# Patient Record
Sex: Female | Born: 1970 | Race: White | Hispanic: Yes | Marital: Married | Smoking: Never smoker
Health system: Southern US, Community
[De-identification: ages and names within clinical notes are randomized; demographics above are authoritative.]

---

## 2005-12-19 ENCOUNTER — Other Ambulatory Visit: Admission: RE | Admit: 2005-12-19 | Discharge: 2005-12-19 | Payer: Self-pay | Admitting: Gynecology

## 2008-07-18 ENCOUNTER — Inpatient Hospital Stay (HOSPITAL_COMMUNITY): Admission: AD | Admit: 2008-07-18 | Discharge: 2008-07-18 | Payer: Self-pay | Admitting: Obstetrics & Gynecology

## 2008-07-18 ENCOUNTER — Encounter: Payer: Self-pay | Admitting: Obstetrics & Gynecology

## 2008-07-21 ENCOUNTER — Inpatient Hospital Stay (HOSPITAL_COMMUNITY): Admission: AD | Admit: 2008-07-21 | Discharge: 2008-07-21 | Payer: Self-pay | Admitting: Obstetrics & Gynecology

## 2008-09-01 ENCOUNTER — Encounter (INDEPENDENT_AMBULATORY_CARE_PROVIDER_SITE_OTHER): Payer: Self-pay | Admitting: Family Medicine

## 2008-09-01 ENCOUNTER — Ambulatory Visit: Payer: Self-pay | Admitting: Family Medicine

## 2008-09-01 DIAGNOSIS — O039 Complete or unspecified spontaneous abortion without complication: Secondary | ICD-10-CM | POA: Insufficient documentation

## 2008-12-22 ENCOUNTER — Ambulatory Visit: Payer: Self-pay | Admitting: Family Medicine

## 2009-01-06 ENCOUNTER — Encounter: Payer: Self-pay | Admitting: Family Medicine

## 2009-01-06 ENCOUNTER — Ambulatory Visit: Payer: Self-pay | Admitting: Family Medicine

## 2009-01-06 LAB — CONVERTED CEMR LAB
Basophils Relative: 0 % (ref 0–1)
Eosinophils Absolute: 0.2 10*3/uL (ref 0.0–0.7)
HCT: 34.6 % — ABNORMAL LOW (ref 36.0–46.0)
Hemoglobin: 11.6 g/dL — ABNORMAL LOW (ref 12.0–15.0)
Lymphs Abs: 2.5 10*3/uL (ref 0.7–4.0)
MCHC: 33.5 g/dL (ref 30.0–36.0)
MCV: 89.2 fL (ref 78.0–100.0)
Monocytes Absolute: 0.6 10*3/uL (ref 0.1–1.0)
Monocytes Relative: 7 % (ref 3–12)
Neutrophils Relative %: 60 % (ref 43–77)
RBC: 3.88 M/uL (ref 3.87–5.11)
Rh Type: POSITIVE
Rubella: >500 intl units/mL — ABNORMAL HIGH
Sickle Cell Screen: NEGATIVE
WBC: 8 10*3/uL (ref 4.0–10.5)

## 2009-01-07 ENCOUNTER — Encounter: Payer: Self-pay | Admitting: Family Medicine

## 2009-01-09 ENCOUNTER — Encounter: Payer: Self-pay | Admitting: Family Medicine

## 2009-01-17 ENCOUNTER — Encounter: Payer: Self-pay | Admitting: Family Medicine

## 2009-01-17 ENCOUNTER — Ambulatory Visit: Payer: Self-pay | Admitting: Family Medicine

## 2009-01-17 LAB — CONVERTED CEMR LAB: GC Probe Amp, Genital: NEGATIVE

## 2009-01-26 ENCOUNTER — Encounter: Payer: Self-pay | Admitting: Family Medicine

## 2009-01-26 ENCOUNTER — Ambulatory Visit: Payer: Self-pay | Admitting: Family Medicine

## 2009-01-26 DIAGNOSIS — O99810 Abnormal glucose complicating pregnancy: Secondary | ICD-10-CM | POA: Insufficient documentation

## 2009-01-26 LAB — CONVERTED CEMR LAB: Glucose, Fasting: 86 mg/dL (ref 70–99)

## 2009-02-15 ENCOUNTER — Encounter: Payer: Self-pay | Admitting: Family Medicine

## 2009-02-15 ENCOUNTER — Ambulatory Visit: Payer: Self-pay | Admitting: Family Medicine

## 2009-02-15 DIAGNOSIS — O09529 Supervision of elderly multigravida, unspecified trimester: Secondary | ICD-10-CM | POA: Insufficient documentation

## 2009-02-17 ENCOUNTER — Encounter: Payer: Self-pay | Admitting: Family Medicine

## 2009-02-22 ENCOUNTER — Ambulatory Visit (HOSPITAL_COMMUNITY): Admission: RE | Admit: 2009-02-22 | Discharge: 2009-02-22 | Payer: Self-pay | Admitting: Family Medicine

## 2009-02-22 ENCOUNTER — Encounter: Payer: Self-pay | Admitting: Family Medicine

## 2009-03-17 ENCOUNTER — Ambulatory Visit: Payer: Self-pay | Admitting: Family Medicine

## 2009-04-20 ENCOUNTER — Ambulatory Visit: Payer: Self-pay | Admitting: Family Medicine

## 2009-05-03 ENCOUNTER — Ambulatory Visit: Payer: Self-pay | Admitting: Family Medicine

## 2009-05-04 ENCOUNTER — Encounter: Payer: Self-pay | Admitting: Family Medicine

## 2009-05-04 ENCOUNTER — Ambulatory Visit: Payer: Self-pay | Admitting: Family Medicine

## 2009-05-15 ENCOUNTER — Encounter: Payer: Self-pay | Admitting: Family Medicine

## 2009-05-15 ENCOUNTER — Ambulatory Visit: Payer: Self-pay | Admitting: Family Medicine

## 2009-05-15 LAB — CONVERTED CEMR LAB
Chlamydia, DNA Probe: NEGATIVE
Whiff Test: NEGATIVE

## 2009-05-23 ENCOUNTER — Telehealth: Payer: Self-pay | Admitting: *Deleted

## 2009-06-01 ENCOUNTER — Ambulatory Visit: Payer: Self-pay | Admitting: Family Medicine

## 2009-06-19 ENCOUNTER — Encounter: Payer: Self-pay | Admitting: Family Medicine

## 2009-06-19 ENCOUNTER — Ambulatory Visit: Payer: Self-pay | Admitting: Family Medicine

## 2009-06-19 LAB — CONVERTED CEMR LAB: Chlamydia, DNA Probe: NEGATIVE

## 2009-06-20 ENCOUNTER — Encounter: Payer: Self-pay | Admitting: Family Medicine

## 2009-06-29 ENCOUNTER — Ambulatory Visit: Payer: Self-pay | Admitting: Family Medicine

## 2009-06-29 ENCOUNTER — Encounter: Payer: Self-pay | Admitting: Family Medicine

## 2009-06-29 DIAGNOSIS — B951 Streptococcus, group B, as the cause of diseases classified elsewhere: Secondary | ICD-10-CM | POA: Insufficient documentation

## 2009-06-29 LAB — CONVERTED CEMR LAB

## 2009-07-06 ENCOUNTER — Encounter: Payer: Self-pay | Admitting: *Deleted

## 2009-07-06 ENCOUNTER — Ambulatory Visit: Payer: Self-pay | Admitting: Family Medicine

## 2009-07-11 ENCOUNTER — Ambulatory Visit: Payer: Self-pay | Admitting: Family Medicine

## 2009-07-11 DIAGNOSIS — Z6841 Body Mass Index (BMI) 40.0 and over, adult: Secondary | ICD-10-CM | POA: Insufficient documentation

## 2009-07-11 DIAGNOSIS — E669 Obesity, unspecified: Secondary | ICD-10-CM | POA: Insufficient documentation

## 2009-07-16 ENCOUNTER — Inpatient Hospital Stay (HOSPITAL_COMMUNITY): Admission: AD | Admit: 2009-07-16 | Discharge: 2009-07-18 | Payer: Self-pay | Admitting: Obstetrics & Gynecology

## 2009-07-16 ENCOUNTER — Encounter: Payer: Self-pay | Admitting: Family Medicine

## 2009-07-16 ENCOUNTER — Encounter: Payer: Self-pay | Admitting: Obstetrics & Gynecology

## 2009-07-16 ENCOUNTER — Ambulatory Visit: Payer: Self-pay | Admitting: Obstetrics and Gynecology

## 2009-08-24 ENCOUNTER — Ambulatory Visit: Payer: Self-pay | Admitting: Family Medicine

## 2009-10-13 ENCOUNTER — Ambulatory Visit: Payer: Self-pay | Admitting: Family Medicine

## 2009-10-13 DIAGNOSIS — L600 Ingrowing nail: Secondary | ICD-10-CM | POA: Insufficient documentation

## 2009-10-13 DIAGNOSIS — B351 Tinea unguium: Secondary | ICD-10-CM | POA: Insufficient documentation

## 2009-10-16 ENCOUNTER — Encounter: Payer: Self-pay | Admitting: Family Medicine

## 2009-11-06 ENCOUNTER — Ambulatory Visit: Payer: Self-pay | Admitting: Family Medicine

## 2010-07-31 NOTE — Miscellaneous (Signed)
Summary: Fax records  Clinical Lists Changes   Printed out prenatal summary with + GBS and asked red team [facility].  1o MD [facility], backup Sharen Hones ( but i'll be gone from next week until 1/31.) Eustaquio Boyden  MD  July 06, 2009 11:26 AM  summary [facility]ed [facility].Ezzard Ditmer CMA  July 06, 2009 12:00 PM

## 2010-07-31 NOTE — Assessment & Plan Note (Signed)
Summary: f/u eo   Vital Signs:  Patient profile:   40 year old female Weight:      168.1 pounds Temp:     98.2 degrees F oral Pulse rate:   68 / minute Pulse rhythm:   regular BP sitting:   107 / 73  (left arm) Cuff size:   large  Vitals Entered By: Loralee Pacas CMA (Nov 06, 2009 4:11 PM)  Primary Care Provider:  Ellin Mayhew MD   History of Present Illness: CC: f/u infected toenail.  s/p partial R lateral toenail removal with matrixectomy 10/13/2009.  Returns today for f/u.  Doing overall well, still notes has discharge.  Only using flipflops, has been using daily peroxide, alcohol and antibiotic ointment.  Completed abx course.  not using anything for pain.  Does have fungal nail infection rest of right big toe.  Current Medications (verified): 1)  Prenatal Vitamins 0.8 Mg Tabs (Prenatal Multivit-Min-Fe-Fa) .... Take One By Mouth Daily 2)  Ibuprofen 600 Mg Tabs (Ibuprofen) .... One By Mouth Q 6 Hours As Needed Toe Pain.  Allergies (verified): No Known Drug Allergies  Past History:  Past medical, surgical, family and social histories (including risk factors) reviewed for relevance to current acute and chronic problems.  Past Medical History: Reviewed history from 10/13/2009 and no changes required. normal pap smear & complete physical at [facility] Jan 2010. [id number] miscarriage Jan 2010 multiple right big toe infections  Past Surgical History: Reviewed history from 10/13/2009 and no changes required. C section x 1 in 1991 Vaginal delivery 1998 recurrent toenail infections/onychomycosis with removals.  Family History: Reviewed history from 01/17/2009 and no changes required. father- healthy mother- DM2, HTN, HLD 5 sisters- healthy  Social History: Reviewed history from 12/22/2008 and no changes required. Moved to [place] 13 years ago from [place].  Has 2 children 11yo and 17yo.  Married x 19 years.  Works at [employer] at [employer].  Occasional alcohol,  no tobacco, no drugs.  Physical Exam  General:  VSS  Well-developed,well-nourished,in no acute distress; alert,appropriate and cooperative throughout examination Pulses:  2+ periph pulses Extremities:  right great toenail lateral s/p removal, erythema lateral nailbed, small amount of pus exuded with pushing.  granulation tissue apparent but has not been able to take    Impression & Recommendations:  Problem # 1:  ONYCHOMYCOSIS, TOENAILS (ICD-110.1) Discussed nail care.  could be candidate for oral antifungal vs funginail, but would want to see nail better healed prior to starting this regimen.  Stop hydrogen peroxide as this is likely causing too much debridement and impeding proper would healing.  re dressed toe and showed how to do dressing changes, advised to only use abx ointment.  RTC in 1 1/2 wks if not improved.  Orders: [facility]- Est Level  3 (45409)  Complete Medication List: 1)  Prenatal Vitamins 0.8 Mg Tabs (Prenatal multivit-min-fe-fa) .... Take one by mouth daily 2)  Ibuprofen 600 Mg Tabs (Ibuprofen) .... One by mouth q 6 hours as needed toe pain.

## 2010-07-31 NOTE — Assessment & Plan Note (Signed)
Summary: 38 5wk ROB   Vital Signs:  Patient profile:   40 year old female Height:      59.75 inches Weight:      180 pounds BMI:     35.58 BP sitting:   129 / 75  (right arm) Cuff size:   large  Vitals Entered By: Tessie Fass CMA (July 11, 2009 11:52 AM) CC: OB visit   CC:  OB visit.  Habits & Providers  Alcohol-Tobacco-Diet     Cigarette Packs/Day: n/a  Allergies: No Known Drug Allergies   Impression & Recommendations:  Problem # 1:  SUPERVISION OF OTHER NORMAL PREGNANCY (ICD-V22.1)  [id number] at 38 5 wks today.  LMP 10/15/2008, BEDC 07/20/2009.  Anatomy US WNL.  Prenatal labs reviewed, WNL, A+. AMA, declined genetic counseling, Quad screen WNL.  good FHT today.   Unsure name yet.  Passed 3 hour glucola.  GBS POSITIVE.  Weight has remained stable at 180 over last several weeks.  Would like to bring child to [facility] (other children go here).  Last child 12 years ago.    Primary doc : Jeanice Lim Back-up: Sharen Hones Female - Alexander Maximilio Circ- femina BC: Plan for Vasectomy for husband  Orders: Other OB visit- [facility] (OBCK)  Problem # 2:  GROUP B STREPTOCOCCUS INFECTION (ICD-041.02)  needs ppx abx at delivery.  informed of this.  Orders: Other OB visit- [facility] (OBCK)  Problem # 3:  ADVANCED MATERNAL AGE (ICD-659.60)  normal quad screen.  declined genetic counseling.   Orders: Other OB visit- [facility] (OBCK)  Problem # 4:  OBESITY, UNSPECIFIED (ICD-278.00) had been walking during pregnancy.  advised to drink plenty of fluid.  Complete Medication List: 1)  Prenatal Vitamins 0.8 Mg Tabs (Prenatal multivit-min-fe-fa) .... Take one by mouth daily  Patient Instructions: 1)  Return in 1 wk, schedule with Dr. Jeanice Lim or [facility]. 2)  Regresar en 1 semana para otra visita. 3)  Por favor llamar a clinica o ir a [facility] si empieza a tener sangre por la vagina, si siente que se le rompe la fuente, si empieza a tener contracciones frequentes, o si no siente que su bebe se  esta moviendo.  Gusto verla hoy!   Flowsheet View for Follow-up Visit    Estimated weeks of       gestation:     38 5/7    Weight:     180    Blood pressure:   129 / 75    Hx headache?     No    Nausea/vomiting?   No    Edema?     TrLE    Bleeding?     no    Leakage/discharge?   no    Fetal activity:       yes    Labor symptoms?   no    Fundal height:      38    FHR:       140s    Fetal position:      vertex    Taking Vitamins?   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

## 2010-07-31 NOTE — Assessment & Plan Note (Signed)
Summary: ROB 40 5/7    Vital Signs:  Patient profile:   40 year old female Weight:      180 pounds Pulse rate:   94 / minute BP sitting:   137 / 77  (left arm)  Vitals Entered By: Theresia Lo RN CC: OB follow up Is Patient Diabetic? No Pain Assessment Patient in pain? no      LMP (date): 10/13/2008 EDC by LMP==> 07/20/2009   CC:  OB follow up.  Habits & Providers  Alcohol-Tobacco-Diet     Tobacco Status: never     Cigarette Packs/Day: n/a  Allergies: No Known Drug Allergies   Impression & Recommendations:  Problem # 1:  SUPERVISION OF OTHER NORMAL PREGNANCY (ICD-V22.1)  [id number] at 40 5 wks today.  LMP 10/15/2008, BEDC 07/20/2009.  Anatomy US WNL.  Prenatal labs reviewed, WNL, A+. AMA, declined genetic counseling, Quad screen WNL.  good FHT today.   Unsure name yet.  Passed 3 hour glucola.  GBS POSITIVE.  Will monitor blood pressure.  Consider UA at next visit.  Would like to bring child to [facility] (other children go here).  Last child 12 years ago.    Primary doc : Jeanice Lim Back-up: Sharen Hones Female Circ- femina BC: Plan for Vasectomy for husband  Orders: Other OB visit- [facility] (OBCK)  Problem # 2:  GROUP B STREPTOCOCCUS INFECTION (ICD-041.02) needs ppx abx at delivery.  informed of this. Orders: Other OB visit- [facility] (OBCK)  Problem # 3:  ADVANCED MATERNAL AGE (ICD-659.60) normal quad screen.  declined genetic counseling.  Orders: Other OB visit- [facility] (OBCK)  Complete Medication List: 1)  Prenatal Vitamins 0.8 Mg Tabs (Prenatal multivit-min-fe-fa) .... Take one by mouth daily  Patient Instructions: 1)  Return in 1 wk, schedule with Dr. Jeanice Lim or [facility] clinic. 2)  Regresar en 1 semana para otra visita. 3)  Mucha agua, siga con vitaminas prenatales.  4)  Por favor llamar a clinica o ir a [facility] si empieza a tener sangre por la vagina, si siente que se le rompe la fuente, si empieza a tener contracciones frequentes, o si no siente que su bebe se esta  moviendo.  Gusto verla hoy!   Flowsheet View for Follow-up Visit    Estimated weeks of       gestation:     38 0/7    Weight:     180    Blood pressure:   137 / 77    Hx headache?     No    Nausea/vomiting?   No    Edema?     TrLE    Bleeding?     no    Leakage/discharge?   no    Fetal activity:       yes    Labor symptoms?   no    Fundal height:      37    FHR:       140s    Fetal position:      vertex    Taking Vitamins?   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

## 2010-07-31 NOTE — Miscellaneous (Signed)
Summary: Procedure Consent   Procedure Consent   Imported By: Bradly Bienenstock 10/16/2009 17:14:49  _____________________________________________________________________  External Attachment:    Type:   Image     Comment:   External Document

## 2010-07-31 NOTE — Assessment & Plan Note (Signed)
Summary: 6 wk pp ck   Vital Signs:  Patient profile:   40 year old female Height:      59.75 inches Weight:      167 pounds Pulse rate:   68 / minute BP sitting:   118 / 77  (right arm)  Vitals Entered By: Arlyss Repress CMA, (August 24, 2009 1:47 PM) CC: post partum check Is Patient Diabetic? No Pain Assessment Patient in pain? no        Primary Care Provider:  Ellin Mayhew MD  CC:  post partum check.  History of Present Illness: Pt here for 6 weeks postpartum check up.  - pt states that her baby is doing well and that she and her family are enjoying him.  Pt denies any depression or anhedonia.  She states that she would like to use condoms only for birth control currently and is not interested at this time with the other forms of birth control.  Pt states that she is aware of the forms of birth control that will not effect her milk production but she doesn't want these at this time.  She reports a small amount of brown spotting now that has continued to slow.  Only using a pantiliner now for this spotting.    Habits & Providers  Alcohol-Tobacco-Diet     Tobacco Status: never  Current Medications (verified): 1)  Prenatal Vitamins 0.8 Mg Tabs (Prenatal Multivit-Min-Fe-Fa) .... Take One By Mouth Daily  Allergies (verified): No Known Drug Allergies  Review of Systems  The patient denies anorexia, fever, vision loss, chest pain, peripheral edema, and prolonged cough.    Physical Exam  General:  VSS  Well-developed,well-nourished,in no acute distress; alert,appropriate and cooperative throughout examination Lungs:  Normal respiratory effort, chest expands symmetrically. Lungs are clear to auscultation, no crackles or wheezes. Heart:  Normal rate and regular rhythm. S1 and S2 normal without gallop, murmur, click, rub or other extra sounds. Abdomen:  Bowel sounds positive,abdomen soft and non-tender without masses, organomegaly or hernias noted. Genitalia:  Pelvic  Exam:        External: normal female genitalia without lesions or masses        Vagina: normal without lesions or masses        Cervix: normal without lesions or masses        Adnexa: normal bimanual exam without masses or fullness        Uterus: normal by palpation        Pap smear: not performed Psych:  PHQ-2 negative   Impression & Recommendations:  Problem # 1:  SUPERVISION OF OTHER NORMAL PREGNANCY (ICD-V22.1) Pt postpartum exam within normal limits.   no concerns at this time.  Instructed pt to please notify me if she has any symptoms of postpartum depression.  Pt told that she no longer has to remain on pelvic rest and can return to regular activity as tolerated.  Pt is to return if any concerns.  Otherwise I would like to see her back for a yearly CPE next year.    [facility]  Complete Medication List: 1)  Prenatal Vitamins 0.8 Mg Tabs (Prenatal multivit-min-fe-fa) .... Take one by mouth daily

## 2010-07-31 NOTE — Assessment & Plan Note (Signed)
Summary: infected toenail,df   Vital Signs:  Patient profile:   40 year old female Height:      59.75 inches Weight:      169 pounds BMI:     33.40 Temp:     98.3 degrees F oral Pulse rate:   73 / minute BP sitting:   127 / 77  (left arm) Cuff size:   regular  Vitals Entered By: Tessie Fass CMA (October 13, 2009 11:03 AM) CC: infected toenail on right foot, C-V Risk Management   Primary Care Provider:  Ellin Mayhew MD  CC:  infected toenail on right foot and C-V Risk Management.  History of Present Illness: CC: infected toenail.  R infected toenail medial side.  2 wk hx.  2 yrs ago had toenail removal with matrixectomy.  Has had nail removed 3x in past.  taking motrin for pain and powder from [place].  Pus discharge and erythematous.  chronic fungal nail infection.  Habits & Providers  Alcohol-Tobacco-Diet     Tobacco Status: never  Allergies (verified): No Known Drug Allergies  Past History:  Past Medical History: normal pap smear & complete physical at [facility] Jan 2010. [id number]- miscarriage Jan 2010 multiple right big toe infections  Past Surgical History: C section x 1 in 1991 Vaginal delivery 1998 recurrent toenail infections/onychomycosis with removals.  Physical Exam  General:  VSS  Well-developed,well-nourished,in no acute distress; alert,appropriate and cooperative throughout examination Extremities:  right great toe with thick brittle fungal nail, ingrown with erythema and pus in medial nail bed.     Impression & Recommendations:  Problem # 1:  INGROWING NAIL (ICD-703.0)  partial toenail removal with matrixectomy today.  discussed wound care and short course of antibiotics.  RTC 3 wks to discuss lamisil vs funginail.  Her updated medication list for this problem includes:    Keflex 500 Mg Caps (Cephalexin) ..... One by mouth two times a day x 3 days  Orders: [facility]- Est Level  3 (99213) Nail excision, partial or complete, permanent  (11750)  Problem # 2:  ONYCHOMYCOSIS, TOENAILS (ICD-110.1)  see above.  Discussed nail care and medication treatment options.   Orders: [facility]- Est Level  3 (99213) Nail excision, partial or complete, permanent (11750)  Complete Medication List: 1)  Prenatal Vitamins 0.8 Mg Tabs (Prenatal multivit-min-fe-fa) .... Take one by mouth daily 2)  Keflex 500 Mg Caps (Cephalexin) .... One by mouth two times a day x 3 days 3)  Ibuprofen 600 Mg Tabs (Ibuprofen) .... One by mouth q 6 hours as needed toe pain.  Patient Instructions: 1)  Sigue con motrin, antibiotico por 1 semana.  esta bien con dar de pecho. 2)  Elevar el pie por el primer dia.   3)  Cambie el dressing maana por la [place], y lavese el pie 2-3 veces al dia con jabon. 4)  Gusto verla.  Regrese en 3 semanas para hablar sobre que hacer para el ongo en la ua.   Procedure Note  Nail Treatment: Indication: infected nail bed Consent signed: yes  Procedure # 1: nail bed removal w/chemical matrixectomy    Region: R big toe medial 1/2    Technique: #15 blade w/scissors    Anesthesia: 1% lidocaine w/o epinephrine    Comment: IC obtained and in chart.  site prepped in sterile fashion.  anesthesia achieved with digital block using about 7cc of 1% lidocaine.  phenol used for matrixectomy with buffering with etOH.  minimal blood loss, pt tolerated procedure well.  Cleaned and prepped with: alcohol and betadine Wound dressing: bacitracin and bulky gauze dressing Instructions: daily dressing changes and elevate  Prescriptions: IBUPROFEN 600 MG TABS (IBUPROFEN) one by mouth q 6 hours as needed toe pain.  #30 x 1   Entered and Authorized by:   Eustaquio Boyden  MD   Signed by:   Eustaquio Boyden  MD on 10/13/2009   Method used:   Electronically to        [facility]  [address] [phone]* (retail)       [address]       [place], [place]  [address]       Ph: [phone]       Fax: [phone]   RxID:    [id number] KEFLEX 500 MG CAPS (CEPHALEXIN) one by mouth two times a day x 3 days  #6 x 0   Entered and Authorized by:   Eustaquio Boyden  MD   Signed by:   Eustaquio Boyden  MD on 10/13/2009   Method used:   [facility]        [facility]  [address] [phone]* (retail)       [address]       [place], [place]  [address]       Ph: [phone]       Fax: [phone]   RxID:   [id number]

## 2010-08-07 ENCOUNTER — Encounter: Payer: Self-pay | Admitting: *Deleted

## 2010-09-16 LAB — CBC
HCT: 34.2 % — ABNORMAL LOW (ref 36.0–46.0)
Hemoglobin: 11.7 g/dL — ABNORMAL LOW (ref 12.0–15.0)
MCV: 92.6 fL (ref 78.0–100.0)
WBC: 8.6 10*3/uL (ref 4.0–10.5)

## 2010-10-07 LAB — GLUCOSE, CAPILLARY
Glucose-Capillary: 100 mg/dL — ABNORMAL HIGH (ref 70–99)
Glucose-Capillary: 152 mg/dL — ABNORMAL HIGH (ref 70–99)

## 2010-10-15 LAB — CBC
HCT: 36.3 % (ref 36.0–46.0)
Hemoglobin: 12.3 g/dL (ref 12.0–15.0)
MCHC: 33.9 g/dL (ref 30.0–36.0)
MCV: 92.6 fL (ref 78.0–100.0)
Platelets: 235 10*3/uL (ref 150–400)
RBC: 3.92 MIL/uL (ref 3.87–5.11)
RDW: 13.1 % (ref 11.5–15.5)
WBC: 11.6 10*3/uL — ABNORMAL HIGH (ref 4.0–10.5)

## 2010-10-15 LAB — HCG, QUANTITATIVE, PREGNANCY
hCG, Beta Chain, Quant, S: 307 m[IU]/mL — ABNORMAL HIGH (ref <5)
hCG, Beta Chain, Quant, S: 3984 m[IU]/mL — ABNORMAL HIGH (ref <5)

## 2010-10-15 LAB — URINALYSIS, ROUTINE W REFLEX MICROSCOPIC
Bilirubin Urine: NEGATIVE
Glucose, UA: NEGATIVE mg/dL
Ketones, ur: NEGATIVE mg/dL
Leukocytes, UA: NEGATIVE
Nitrite: NEGATIVE
Protein, ur: NEGATIVE mg/dL
Specific Gravity, Urine: 1.02 (ref 1.005–1.030)
Urobilinogen, UA: 0.2 mg/dL (ref 0.0–1.0)
pH: 5 (ref 5.0–8.0)

## 2010-10-15 LAB — URINE MICROSCOPIC-ADD ON

## 2011-11-29 ENCOUNTER — Ambulatory Visit (INDEPENDENT_AMBULATORY_CARE_PROVIDER_SITE_OTHER): Payer: Self-pay | Admitting: Family Medicine

## 2011-11-29 ENCOUNTER — Encounter: Payer: Self-pay | Admitting: Family Medicine

## 2011-11-29 VITALS — BP 110/76 | HR 78 | Temp 98.0°F | Ht 59.75 in | Wt 176.8 lb

## 2011-11-29 DIAGNOSIS — L03032 Cellulitis of left toe: Secondary | ICD-10-CM | POA: Insufficient documentation

## 2011-11-29 MED ORDER — CEPHALEXIN 500 MG PO CAPS: 500.0000 mg | ORAL_CAPSULE | Freq: Two times a day (BID) | ORAL | Status: AC

## 2011-11-29 NOTE — Assessment & Plan Note (Signed)
Patient has a moderate infection, no drainage required. No fungal infection.  Advised to take antibiotics for three days. Advised to soak in hot water BID for 2 days. Advised on red flags to return to clinic. Advised to stop using metallic objects to clean cuticles of nails.

## 2011-11-29 NOTE — Progress Notes (Signed)
  Subjective:   Patient ID: Faith Braun, female DOB: Aug 31, 1970 40 y.o. MRN: [id number] HPI: Translator used.   1. Paronychia of left 1st toe Synopsis: patient has been using a metallic cuticle cleaning device on her toes with resultant infection.  Location: left 1st toe paronychia Onset: has been acute  Time period of: 3 day(s).  Severity is described as moderate.  Aggravating: use of metallic objects on side of nails for cleaning Alleviating: none Associated sx/sn: no fever, movement no prohibited in toe. No cellulitis  History  Substance Use Topics  . Smoking status: Never Smoker   . Smokeless tobacco: Not on file  . Alcohol Use: Not on file    Review of Systems: Pertinent items are noted in HPI.  Labs Reviewed: yes, last in 2012 Reviewed Chart Review for last notes.     Objective:   Filed Vitals:   11/29/11 0902  BP: 110/76  Pulse: 78  Temp: 98 F (36.7 C)  TempSrc: Oral  Height: 4' 11.75" (1.518 m)  Weight: 176 lb 12.8 oz (80.196 kg)   Physical Exam: General: Hispanic female, pleasant Left foot: erythematous and swollen paronychia of the left 1st toe, minimal pus draining, no fluctuant abscess, no fungus evident, nail normal color. AROM intact of the 1st toe. Tender to palpation of the red swollen areas.   Assessment & Plan:

## 2011-11-29 NOTE — Patient Instructions (Signed)
Paroniquia (Paronychia) La paroniquia es una reaccin inflamatoria que involucra los pliegues de la piel que rodea la ua. Generalmente se debe a una infeccin en la piel que rodea la ua. La causa ms comn es el lavado frecuente de las manos (como en el caso de los [place], mozos, enfermeros y Heritage manager personas que necesitan mojarse las manos. Esto hace que la piel que rodea la ua sea susceptible a las infecciones por bacterias (grmenes) u hongos. Otros factores que predisponen son:  Edwin Dada agresivos.   Morderse las uas.   Succionar Multimedia programmer.  La causa ms comn es una infeccin por estafilococo (un germen) o una infeccin por hongos (candida). Cuando la causa es un germen, generalmente el comienzo es sbito y doloroso con enrojecimiento, hinchazn, pus y Engineer, mining. Puede aparecer bajo la ua y formar un absceso (acumulacin de pus) o formar un absceso alrededor de la ua. Si la ua est infectada con un hongo, el tratamiento es prolongado y puede requerir medicamentos por va oral durante un ao. El mdico decidir la cantidad de tiempo que requerir Scientist, research (medical). La paroniquia causada por bacterias (grmenes) puede evitarse si no se quitan los padrastros o se cortan las cutculas. Cuando la infeccin ocurre en la punta del dedo se denomina panadizo. Si la causa es el virus del herpes simplex, se denomina panadizo herptico. TRATAMIENTO El tratamiento consiste en la incisin y el drenaje cuando hay un absceso. Esto significa que el absceso debe abrirse para que el pus pueda salir. Debe seguir los cuidados que se recomiendan a continuacin. INSTRUCCIONES PARA EL CUIDADO DOMICILIARIO  Es importante mantener las reas afectadas limpias y secas. Use guantes de goma o plstico sobre guantes de algodn cuando deba mojarse la [place].   Entre los perodos de enjuagues con agua tibia, mantenga la herida limpia, seca y vendada como se lo indic el profesional que lo asiste.   Si tiene una  infeccin bacteriana, sumerja la mano en agua tibia entre 15 y 20 minutos, tres o cuatro Teacher, early years/pre. Las infecciones fngicas son muy difciles de tratar, por lo tanto requieren tratamiento durante largos perodos.   Tome los antibiticos (medicamentos que [employer] grmenes) para las infecciones bacterianas, segn las indicaciones. Termine todos los [employer], an si el problema parece estar resuelto antes de finalizarlos.   Utilice los medicamentos de venta libre o de prescripcin para Chief Technology Officer, Environmental health practitioner o la [place], segn se lo indique el profesional que lo asiste.  SOLICITE ATENCIN MDICA DE INMEDIATO SI:  Presenta enrojecimiento, hinchazn o aumento del dolor en la herida.   Aparece pus en la herida.   Tiene fiebre.   Advierte un olor ftido que proviene de la herida o del vendaje.  Document Released: 03/27/2005 Document Revised: 06/06/2011 [facility] Patient Information 2012 [place], [place].

## 2011-12-06 ENCOUNTER — Ambulatory Visit: Payer: Self-pay | Admitting: Family Medicine

## 2011-12-22 ENCOUNTER — Ambulatory Visit: Payer: Self-pay | Admitting: Family Medicine

## 2011-12-22 VITALS — BP 107/68 | HR 77 | Temp 98.7°F | Resp 16 | Ht 60.25 in | Wt 175.4 lb

## 2011-12-22 DIAGNOSIS — L600 Ingrowing nail: Secondary | ICD-10-CM

## 2011-12-22 DIAGNOSIS — M79609 Pain in unspecified limb: Secondary | ICD-10-CM

## 2011-12-22 MED ORDER — HYDROCODONE-ACETAMINOPHEN 5-500 MG PO TABS: 1.0000 | ORAL_TABLET | Freq: Three times a day (TID) | ORAL | Status: AC | PRN

## 2011-12-22 NOTE — Progress Notes (Signed)
This 41 year old married woman comes in with a right great toe ingrown toenail has been going on for a month. A similar problem on the left side which she had a total nail removal and it has not come back. She continues to have drainage and swelling along the medial border of the nail, although the lateral side also is tender.  She initially sought care at a different facility and they told her antibiotics will be sufficient. She took the antibiotics but the pain and swelling did not clear.  Objective: Tibial side of right great toe nail shows cellulitis and proud flesh. The fibular side of the right great toenail is tender. After informed consent, patient moved to room 7 where the right great toe was anesthetized with digital block using 5 cc marcaine bilaterally.   Assessment: Ingrown toenail, no need for antibiotic  Plan:  vicodin for pain Wash daily starting tomorrow and using bandaids Return prn

## 2014-09-21 ENCOUNTER — Encounter: Payer: Self-pay | Admitting: Obstetrics & Gynecology

## 2014-11-18 ENCOUNTER — Other Ambulatory Visit: Payer: Self-pay | Admitting: Obstetrics and Gynecology

## 2014-11-18 DIAGNOSIS — Z1231 Encounter for screening mammogram for malignant neoplasm of breast: Secondary | ICD-10-CM

## 2014-12-07 ENCOUNTER — Encounter (HOSPITAL_COMMUNITY): Payer: Self-pay | Admitting: *Deleted

## 2014-12-08 ENCOUNTER — Ambulatory Visit (HOSPITAL_COMMUNITY)
Admission: RE | Admit: 2014-12-08 | Discharge: 2014-12-08 | Disposition: A | Discharge status: Home / Self Care | Payer: Self-pay | Source: Ambulatory Visit | Attending: Obstetrics and Gynecology | Admitting: Obstetrics and Gynecology

## 2014-12-08 ENCOUNTER — Encounter (HOSPITAL_COMMUNITY): Payer: Self-pay

## 2014-12-08 VITALS — BP 114/80 | Temp 98.8°F | Ht 59.0 in | Wt 179.0 lb

## 2014-12-08 DIAGNOSIS — Z1231 Encounter for screening mammogram for malignant neoplasm of breast: Secondary | ICD-10-CM

## 2014-12-08 DIAGNOSIS — Z1239 Encounter for other screening for malignant neoplasm of breast: Secondary | ICD-10-CM

## 2014-12-08 NOTE — Progress Notes (Signed)
CLINIC:  Breast & Cervical Cancer Control Program [employer]) Clinic  REASON FOR VISIT: Well-woman exam and screening mammogram.    HISTORY OF PRESENT ILLNESS:  [name] is a 44 y.o. female who presents to the [facility] today for clinical breast exam. No family history of breast cancer. She has no complaints today.  Her last pap smear was in 08/2014 and was negative.  She has no history of abnormal pap smears.   REVIEW OF SYSTEMS:  Denies any breast pain, nodularity, nipple inversion, or nipple discharge bilaterally.   ALLERGIES: No Known Allergies  CURRENT MEDICATIONS:  Current Outpatient Prescriptions on File Prior to Encounter  Medication Sig Dispense Refill  . ibuprofen (ADVIL,MOTRIN) 600 MG tablet Take 600 mg by mouth every 6 (six) hours as needed. Toe pain     . Prenatal Multivit-Min-Fe-FA (PRENATAL VITAMINS) 0.8 MG tablet Take 1 tablet by mouth daily.       No current facility-administered medications on file prior to encounter.    SOCIAL HISTORY:  [name] is married and has 3 children, the youngest is 5 and the oldest is 50.  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    PHYSICAL EXAM:  Vitals:  Filed Vitals:   12/08/14 0845  BP: 114/80  Temp: 98.8 F (37.1 C)   General: Well-nourished, well-appearing female in no acute distress.  She is unaccompanied in clinic today.  Stoney Bang, LPN and Spanish language interpreter, Delorise Royals were present during physical exam for this patient.  Breasts: Breasts symmetrical without evidence of skin redness, thickening, or peau d'orange appearance. No nipple retraction or nipple discharge noted bilaterally.  No breast nodularity palpated in bilateral breasts.  Normal fibrocystic breast changes noted in bilateral breasts. Axillary lymph nodes: No axillary lymphadenopathy bilaterally.   GU: Exam deferred. Pap smear is up-to-date.  ASSESSMENT & PLAN:   1. Breast  cancer screening: [name] has no palpable breast abnormalities on her clinical breast exam today.  She will receive her screening mammogram as scheduled.  She will be contracted by the imaging center for results of the mammogram, either by letter or phone within the next few weeks.  She was given instructions and educational materials regarding breast self-awareness. [name] is aware of this plan and agrees with it.    [name] was encouraged to ask questions and all questions were answered to her satisfaction.    Lubertha Basque, NP [facility]  [phone]

## 2014-12-29 ENCOUNTER — Other Ambulatory Visit: Payer: Self-pay

## 2014-12-29 ENCOUNTER — Ambulatory Visit (HOSPITAL_BASED_OUTPATIENT_CLINIC_OR_DEPARTMENT_OTHER): Payer: Self-pay

## 2014-12-29 VITALS — BP 108/68 | HR 68 | Temp 99.0°F | Resp 14 | Ht 59.0 in | Wt 177.7 lb

## 2014-12-29 DIAGNOSIS — Z0000 Encounter for general adult medical examination without abnormal findings: Secondary | ICD-10-CM

## 2014-12-29 LAB — HEMOGLOBIN A1C
Hgb A1c MFr Bld: 5.5 % (ref <5.7)
Mean Plasma Glucose: 111 mg/dL (ref <117)

## 2014-12-29 LAB — LIPID PANEL
CHOL/HDL RATIO: 3.2 ratio
Cholesterol: 165 mg/dL (ref 0–200)
HDL: 52 mg/dL (ref >=46)
LDL Cholesterol: 95 mg/dL (ref 0–99)
TRIGLYCERIDES: 91 mg/dL (ref <150)
VLDL: 18 mg/dL (ref 0–40)

## 2014-12-29 LAB — GLUCOSE (CC13): Glucose: 100 mg/dl (ref 70–140)

## 2014-12-29 NOTE — Patient Instructions (Signed)
Discussed what will result from lab work and vital signs. Will write lab results on number sheet when results called.  Will do risk reduction counseling.Verbalized understanding.

## 2014-12-29 NOTE — Progress Notes (Signed)
[facility] and is currently a [facility] patient effective 12/08/2014. Patient did not have interpreter but understood most [place] and spoke [place].   Clinical Measurements: Patient is 4 ft. 11 inches, weight 177.6 lbs, waist circumference 38 inches, and hip circumference 44.5inches.   Medical History: Patient has no history of high cholesterol. Patient does not have a history of hypertension or diabetes. Per patient no diagnosed history of coronary heart disease, heart attack, heart failure, stroke/TIA, vascular disease or congenital heart defects.   Blood Pressure, Self-measurement: Patient states has no reason to check Blood pressure.  Nutrition Assessment: Patient stated that eats 1 fruit every day. Patient states she does not eat vegetables much. Per patient some days eat and some not.Per patient does not eat 3 or more ounces of whole grains daily. Patient does eat two or more servings of fish weekly. Patient states she does drink more than 36 ounces or 450 calories of beverages with added sugars weekly.Patient stated she does not watch her salt intake.   Physical Activity Assessment: Patient stated she does walk or stands on feet constantly for 7 hours every day, 5 days a week. 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   Smoking Status: Patient stated has never smoked. Per patient is not exposed to smoke.  Quality of Life Assessment: In assessing patient's quality of life she stated that out of the past 30 days that she has felt her health is good all of them. Patient also stated that in the past 30 days that her mental health is good including stress, depression and problems with emotions for all days. Patient did state that out of the past 30 days she felt her physical or mental health had not kept her from doing her usual activities including self-care, work or recreation.   Plan: Lab work will be done today including a lipid panel,  blood glucose, and Hgb A1C. Will call lab results when they are finished. Will do risk reduction counseling when call results.

## 2015-01-03 ENCOUNTER — Telehealth: Payer: Self-pay

## 2015-01-03 NOTE — Telephone Encounter (Signed)
Second time calling [facility] Left message for patient to return call.

## 2015-01-09 ENCOUNTER — Telehealth: Payer: Self-pay

## 2015-01-09 NOTE — Telephone Encounter (Signed)
Patient had

## 2015-01-09 NOTE — Telephone Encounter (Signed)
Faith Braun called for patient because patient stated that I had left message. Faith HesselbachMaria was able to state patients name and DOB. Inform about lab work from 02/11/14. Interpreter informed patient: cholesterol- 183, HDL- 53, LDL- 97, triglycerides - 163, Bld Glucose -117 and HBG-A1C - 5.7.  Set up health Coaching for August 27 at 10 AM at cancer center for nutrition.

## 2015-02-24 ENCOUNTER — Ambulatory Visit: Payer: Self-pay

## 2015-04-07 ENCOUNTER — Telehealth (HOSPITAL_COMMUNITY): Payer: Self-pay

## 2015-04-07 NOTE — Telephone Encounter (Signed)
Called to follow up on if still wanted health coaching for BMI or was not ready or interested. Left message per interpreter Delorise Royals.

## 2015-04-24 ENCOUNTER — Telehealth: Payer: Self-pay

## 2015-04-24 NOTE — Telephone Encounter (Signed)
Called per Delorise RoyalsJulie Sowell to follow up on weather wanted health Coaching since had said she did but did not come to her appointment. Have attempted twice to reach patient and left messages to return call. Patient has not reurned either call.

## 2015-06-14 ENCOUNTER — Telehealth: Payer: Self-pay

## 2015-06-14 NOTE — Telephone Encounter (Signed)
Platient had orgininally stated that wanted to come in about weight loss and cancelled appointment. Now patient voice that she has started on her own and is doing well. Patient stated that did not need any follow up assistance and is not ready.

## 2017-04-07 ENCOUNTER — Encounter (HOSPITAL_COMMUNITY): Payer: Self-pay

## 2019-01-11 ENCOUNTER — Other Ambulatory Visit: Payer: Self-pay

## 2019-01-11 ENCOUNTER — Encounter (HOSPITAL_COMMUNITY): Payer: Self-pay

## 2019-01-11 ENCOUNTER — Ambulatory Visit (HOSPITAL_COMMUNITY)
Admission: EM | Admit: 2019-01-11 | Discharge: 2019-01-11 | Disposition: A | Discharge status: Home / Self Care | Payer: Self-pay | Attending: Urgent Care | Admitting: Urgent Care

## 2019-01-11 ENCOUNTER — Telehealth (HOSPITAL_COMMUNITY): Payer: Self-pay | Admitting: Emergency Medicine

## 2019-01-11 DIAGNOSIS — R0600 Dyspnea, unspecified: Secondary | ICD-10-CM

## 2019-01-11 DIAGNOSIS — R05 Cough: Secondary | ICD-10-CM

## 2019-01-11 DIAGNOSIS — R0789 Other chest pain: Secondary | ICD-10-CM

## 2019-01-11 DIAGNOSIS — Z20822 Contact with and (suspected) exposure to covid-19: Secondary | ICD-10-CM

## 2019-01-11 DIAGNOSIS — R058 Other specified cough: Secondary | ICD-10-CM

## 2019-01-11 DIAGNOSIS — R030 Elevated blood-pressure reading, without diagnosis of hypertension: Secondary | ICD-10-CM

## 2019-01-11 MED ORDER — BENZONATATE 100 MG PO CAPS: 100.0000 mg | ORAL_CAPSULE | Freq: Three times a day (TID) | ORAL | 0 refills | Status: DC | PRN

## 2019-01-11 MED ORDER — ALBUTEROL SULFATE HFA 108 (90 BASE) MCG/ACT IN AERS: 1.0000 | INHALATION_SPRAY | Freq: Four times a day (QID) | RESPIRATORY_TRACT | 0 refills | Status: DC | PRN

## 2019-01-11 NOTE — ED Triage Notes (Signed)
Patient presents to Urgent Care with complaints of centralized cp and cough since a week ago. Patient reports no fevers or sick contacts.

## 2019-01-11 NOTE — ED Provider Notes (Signed)
MRN: [id number] DOB: 12/22/1970  Subjective:   Faith Braun is a 48 y.o. female presenting for 1 week history of persistent moderate dry hacking cough that elicits shortness of breath and midsternal chest pain.  Reports that she also feels shortness of breath when she walks outside.  Has had decreased appetite.  Has tried Tylenol for subjective fever, aspirin with some relief.  Denies history of chronic conditions including hypertension, diabetes, asthma, allergies.  Denies smoking cigarettes or drinking alcohol.  No current facility-administered medications for this encounter.   Current Outpatient Medications:  .  ibuprofen (ADVIL,MOTRIN) 600 MG tablet, Take 600 mg by mouth every 6 (six) hours as needed. Toe pain , Disp: , Rfl:    No Known Allergies  No past medical history on file.   Past Surgical History:  Procedure Laterality Date  . CESAREAN SECTION     one previous    Review of Systems  Constitutional: Positive for fever (subjective). Negative for malaise/fatigue.  HENT: Negative for congestion, ear pain, sinus pain and sore throat.   Eyes: Negative for blurred vision, double vision, discharge and redness.  Respiratory: Positive for cough and shortness of breath. Negative for hemoptysis and wheezing.   Cardiovascular: Positive for chest pain.  Gastrointestinal: Negative for abdominal pain, diarrhea, nausea and vomiting.  Genitourinary: Negative for dysuria, flank pain and hematuria.  Musculoskeletal: Negative for myalgias.  Skin: Negative for rash.  Neurological: Negative for dizziness, weakness and headaches.  Psychiatric/Behavioral: Negative for depression and substance abuse.   Objective:   Vitals: BP (!) 145/78 (BP Location: Left Arm)   Pulse 90   Temp 99 F (37.2 C) (Oral)   Resp 17   SpO2 96%   BP Readings from Last 3 Encounters:  01/11/19 (!) 145/78  12/29/14 108/68  12/08/14 114/80    Physical Exam Constitutional:      General: She is not in  acute distress.    Appearance: Normal appearance. She is well-developed. She is obese. She is not ill-appearing, toxic-appearing or diaphoretic.  HENT:     Head: Normocephalic and atraumatic.     Right Ear: Tympanic membrane and ear canal normal. No drainage or tenderness. No middle ear effusion. Tympanic membrane is not erythematous.     Left Ear: Tympanic membrane and ear canal normal. No drainage or tenderness.  No middle ear effusion. Tympanic membrane is not erythematous.     Nose: Nose normal. No congestion or rhinorrhea.     Mouth/Throat:     Mouth: Mucous membranes are moist. No oral lesions.     Pharynx: Oropharynx is clear. No pharyngeal swelling, oropharyngeal exudate, posterior oropharyngeal erythema or uvula swelling.     Tonsils: No tonsillar exudate or tonsillar abscesses.  Eyes:     Extraocular Movements: Extraocular movements intact.     Right eye: Normal extraocular motion.     Left eye: Normal extraocular motion.     Conjunctiva/sclera: Conjunctivae normal.     Pupils: Pupils are equal, round, and reactive to light.  Neck:     Musculoskeletal: Normal range of motion and neck supple.  Cardiovascular:     Rate and Rhythm: Normal rate and regular rhythm.     Pulses: Normal pulses.     Heart sounds: Normal heart sounds. No murmur. No friction rub. No gallop.   Pulmonary:     Effort: Pulmonary effort is normal. No respiratory distress.     Breath sounds: Normal breath sounds. No stridor. No wheezing, rhonchi or rales.  Lymphadenopathy:  Cervical: No cervical adenopathy.  Skin:    General: Skin is warm and dry.     Findings: No rash.  Neurological:     General: No focal deficit present.     Mental Status: She is alert and oriented to person, place, and time.  Psychiatric:        Mood and Affect: Mood normal.        Behavior: Behavior normal.        Thought Content: Thought content normal.    ED ECG REPORT   Date: 01/11/2019  Rate: 85bpm  Rhythm: normal  sinus rhythm  QRS Axis: normal  Intervals: normal  ST/T Wave abnormalities: nonspecific T wave changes  Conduction Disutrbances:Possible right bundle branch block  Narrative Interpretation: Sinus rhythm at 85 bpm with inverted T waves in lead III and T wave flattening in lead aVF, possible right bundle branch block as seen in RR' in lead V1.  Old EKG Reviewed: none available  I have personally reviewed the EKG tracing and agree with the computerized printout as noted.   Assessment and Plan :   1. Atypical chest pain   2. Dyspnea, unspecified type   3. Dry cough   4. Elevated blood pressure reading in office without diagnosis of hypertension     Case precepted with Dr. Lanny Cramp. Counseled patient on nature of COVID-19 including modes of transmission, diagnostic testing, management and supportive care. Testing is pending.  Will use albuterol inhaler, Tessalon Perles for supportive care. Counseled patient on potential for adverse effects with medications prescribed/recommended today, ER and return-to-clinic precautions discussed, patient verbalized understanding. Counseled on need to establish with a PCP for further work-up and follow-up.  I placed patient in a work you with [facility] for PCP assistance.    Jaynee Eagles, [place] 01/11/19 1947

## 2019-01-12 ENCOUNTER — Telehealth: Payer: Self-pay

## 2019-01-12 DIAGNOSIS — Z20822 Contact with and (suspected) exposure to covid-19: Secondary | ICD-10-CM

## 2019-01-12 NOTE — Telephone Encounter (Signed)
Patient called using [facility] [phone] and advised of the covid request, she verbalized understanding. Appointment scheduled for tomorrow, 01/13/19 at 0830 at [facility], advised of location and to wear a mask for everyone in the vehicle, she verbalized understanding. Order placed.    Needs COVID testing Received: Yesterday Message Contents  Jaynee Eagles, PA-C  [facility] Testing Pool  Cc: Sandria Manly, [place]

## 2019-01-13 ENCOUNTER — Other Ambulatory Visit: Payer: Self-pay

## 2019-01-13 DIAGNOSIS — Z20822 Contact with and (suspected) exposure to covid-19: Secondary | ICD-10-CM

## 2019-01-18 LAB — NOVEL CORONAVIRUS, NAA: SARS-CoV-2, NAA: NOT DETECTED

## 2019-05-31 ENCOUNTER — Other Ambulatory Visit (HOSPITAL_COMMUNITY): Payer: Self-pay | Admitting: *Deleted

## 2019-05-31 DIAGNOSIS — Z1231 Encounter for screening mammogram for malignant neoplasm of breast: Secondary | ICD-10-CM

## 2019-07-29 ENCOUNTER — Encounter (HOSPITAL_COMMUNITY): Payer: Self-pay

## 2019-07-29 ENCOUNTER — Other Ambulatory Visit: Payer: Self-pay

## 2019-07-29 ENCOUNTER — Ambulatory Visit
Admission: RE | Admit: 2019-07-29 | Discharge: 2019-07-29 | Disposition: A | Discharge status: Home / Self Care | Payer: No Typology Code available for payment source | Source: Ambulatory Visit | Attending: Obstetrics and Gynecology | Admitting: Obstetrics and Gynecology

## 2019-07-29 ENCOUNTER — Ambulatory Visit (HOSPITAL_COMMUNITY)
Admission: RE | Admit: 2019-07-29 | Discharge: 2019-07-29 | Disposition: A | Discharge status: Home / Self Care | Payer: Self-pay | Source: Ambulatory Visit | Attending: Obstetrics and Gynecology | Admitting: Obstetrics and Gynecology

## 2019-07-29 DIAGNOSIS — Z1231 Encounter for screening mammogram for malignant neoplasm of breast: Secondary | ICD-10-CM

## 2019-07-29 DIAGNOSIS — Z01419 Encounter for gynecological examination (general) (routine) without abnormal findings: Secondary | ICD-10-CM | POA: Insufficient documentation

## 2019-07-29 NOTE — Patient Instructions (Signed)
Explained breast self awareness with Faith Braun. Let patient know [facility] will cover Pap smears and HPV typing every 5 years unless has a history of abnormal Pap smears. Referred patient to the [facility] for a screening mammogram. Appointment scheduled for Thursday, July 29, 2019 at 1430. Patient aware of appointment and will be there. Let patient know  will follow up with her within the next couple weeks with results of her Pap smear by letter or phone. Informed patient that the Breast Center will follow-up with her within the next couple of weeks with the results of her mammogram by letter or phone. Faith Braun verbalized understanding.  Katye Valek, Kathaleen Maser, RN 11:24 AM

## 2019-07-29 NOTE — Progress Notes (Signed)
No complaints today.   Pap Smear: Pap smear completed today. Last Pap smear was 08/01/2014 at the free cervical cancer screening at the [facility] and normal. Per patient has no history of an abnormal Pap smear. Last Pap smear result is in Epic.  Physical exam: Breasts Breasts symmetrical. No skin abnormalities bilateral breasts. No nipple retraction bilateral breasts. No nipple discharge bilateral breasts. No lymphadenopathy. No lumps palpated bilateral breasts. No complaints of pain or tenderness on exam. Referred patient to the [facility] for a screening mammogram. Appointment scheduled for Thursday, July 29, 2019 at 1430.        Pelvic/Bimanual   Ext Genitalia No lesions, no swelling and no discharge observed on external genitalia.         Vagina Vagina pink and normal texture. No lesions or discharge observed in vagina.          Cervix Cervix is present. Cervix pink and of normal texture. Cervical polyp observed at os on exam. No discharge observed. Will refer patient to the Center for [facility] for follow-up of cervical polyp. Explained to patient visit will not be covered by [facility] and patient given the [facility] Application.    Uterus Uterus is present and palpable. Uterus is retroverted and normal size.        Adnexae Bilateral ovaries present and palpable. No tenderness on palpation.         Rectovaginal No rectal exam completed today since patient had no rectal complaints. No skin abnormalities observed on exam.    Smoking History: Patient has never smoked.  Patient Navigation: Patient education provided. Access to services provided for patient through [facility] program. Spanish interpreter provided.   Breast and Cervical Cancer Risk Assessment: Patient has no family history of breast cancer, known genetic mutations, or radiation treatment to the chest before age 33. Patient has no history of cervical dysplasia,  immunocompromised, or DES exposure in-utero.  Risk Assessment    Risk Scores      07/29/2019   Last edited by: Narda Rutherford, LPN   5-year risk: 0.5 %   Lifetime risk: 5.2 %         Used Spanish interpreter Natale Lay from [place].

## 2019-08-02 LAB — CYTOLOGY - PAP
Comment: NEGATIVE
Diagnosis: NEGATIVE
High risk HPV: NEGATIVE

## 2019-08-03 ENCOUNTER — Other Ambulatory Visit: Payer: Self-pay | Admitting: Obstetrics and Gynecology

## 2019-08-03 DIAGNOSIS — R928 Other abnormal and inconclusive findings on diagnostic imaging of breast: Secondary | ICD-10-CM

## 2019-08-09 ENCOUNTER — Ambulatory Visit: Payer: No Typology Code available for payment source

## 2019-08-11 ENCOUNTER — Other Ambulatory Visit: Payer: Self-pay

## 2019-08-11 ENCOUNTER — Ambulatory Visit
Admission: RE | Admit: 2019-08-11 | Discharge: 2019-08-11 | Disposition: A | Discharge status: Home / Self Care | Payer: No Typology Code available for payment source | Source: Ambulatory Visit | Attending: Obstetrics and Gynecology | Admitting: Obstetrics and Gynecology

## 2019-08-11 DIAGNOSIS — R928 Other abnormal and inconclusive findings on diagnostic imaging of breast: Secondary | ICD-10-CM

## 2019-08-16 ENCOUNTER — Other Ambulatory Visit: Payer: Self-pay

## 2019-08-16 ENCOUNTER — Other Ambulatory Visit: Payer: Self-pay | Admitting: Obstetrics and Gynecology

## 2019-08-16 ENCOUNTER — Inpatient Hospital Stay: Payer: Self-pay | Attending: Obstetrics and Gynecology | Admitting: *Deleted

## 2019-08-16 VITALS — BP 146/90 | Temp 98.0°F | Ht 60.5 in | Wt 202.0 lb

## 2019-08-16 DIAGNOSIS — Z0000 Encounter for general adult medical examination without abnormal findings: Secondary | ICD-10-CM

## 2019-08-16 NOTE — Progress Notes (Signed)
Wisewoman initial screening   interpreter-   Clinical Measurement:  Height: 60.5 in Weight: 202 lb  Blood Pressure: 158/91  Blood Pressure #2: 146/90 Fasting Labs Drawn Today, will review with patient when they result.   Medical History:  Patient states that she does not have a history of high cholesterol, high blood pressure or diabetes.  Medications:  Patient states that she does not take medication to lower cholesterol, blood pressure or blood sugar.  Patient does take an aspirin a day to help prevent a heart attack or stroke.    Blood pressure, self measurement: Patient states that she does not measure blood pressure from home and has not been told to do so by a healthcare provider.   Nutrition: Patient states that on average she eats 0 cups of fruit and 0 cups of vegetables per day. Patient states that she does not eat fish at least 2 times per week. Patient eats less than half servings of whole grains. Patient drinks less than 36 ounces of beverages with added sugar weekly. Patient is currently watching sodium or salt intake. In the past 7 days patient has not had any drinks containing alcohol. On average patient does not drink any drinks containing alcohol.      Physical activity:  Patient states that she gets 0 minutes of moderate and 0 minutes of vigorous physical activity each week.  Smoking status:  Patient states that she has never smoked tobacco.   Quality of life:  Over the past 2 weeks patient states that she has not had any days where she has little interest or pleasure in doing things and 0 days where she has felt down, depressed or hopeless.    Risk reduction and counseling:    Health Coaching: Explained that the recommendation is for 2 cups of fruit and 3 cups of vegetables per day. Encouraged patient to try and start walking for 20 minutes a day. Explained to patient that daily exercising can also help with her elevated blood pressure. Encouraged patient to try and eat heart  healthy fish at least 2 times per week as well as adding in more whole grains into diet.   Navigation:  I will notify patient of lab results.  Patient is aware of 2 more health coaching sessions and a follow up. Will call patient with follow-up appointment information with Internal Medicine for elevated BP.  Time: 20 minutes

## 2019-08-17 LAB — LIPID PANEL W/O CHOL/HDL RATIO
Cholesterol, Total: 187 mg/dL (ref 100–199)
HDL: 62 mg/dL (ref >39)
LDL Chol Calc (NIH): 109 mg/dL — ABNORMAL HIGH (ref 0–99)
Triglycerides: 89 mg/dL (ref 0–149)
VLDL Cholesterol Cal: 16 mg/dL (ref 5–40)

## 2019-08-17 LAB — HGB A1C W/O EAG: Hgb A1c MFr Bld: 5.6 % (ref 4.8–5.6)

## 2019-08-17 LAB — GLUCOSE, RANDOM: Glucose: 101 mg/dL — ABNORMAL HIGH (ref 65–99)

## 2019-08-18 ENCOUNTER — Telehealth: Payer: Self-pay

## 2019-08-18 NOTE — Telephone Encounter (Deleted)
Health coaching 2   interpreter- Natale Lay, [employer]   Labs- 187 cholesterol , 109 LDL cholesterol , 89 triglycerides , 62 HDL cholesterol , 5.6 hemoglobin A1C , 101 mean plasma glucose  Patient understands and is aware of her lab results.   Goals-     Navigation:  Patient is aware of 1 more health coaching sessions and a follow up.   Time-

## 2019-08-19 NOTE — Telephone Encounter (Signed)
Health coaching 2   interpreter- Natale Lay, [employer]   Labs- 187 cholesterol, 109 LDL cholesterol, 89 triglycerides, 62 HDL cholesterol, 5.6 hemoglobin A1C, 101 mean plasma glucose  Patient understands and is aware of her lab results.   Goals-  Discussed lab results in detail with patient and answered any questions regarding results.   Goals-Decrease the amount of fried and fatty foods consumed. Try to grill, bake, broil or sautee foods instead. Reduce the amount of red meats consumed. Look for low-fat or reduced fat dairy options instead of whole fat or whole milk. Add in healthy fats in diet like olive oil, avocados and nuts. Watch the amount of sweets and sugars consumed as well as carbohydrates. Will mail patient the [place] brochure to give ideas of how much foods from each food group that should be consumed each day. Increase the amount of fruits and vegetables consumed each day. With a goal of 2 cups of fruit and 3 cups of vegetables each day. Start trying to exercise for at least 20 minutes a day.   Navigation:  Patient is aware of 1 more health coaching sessions and a follow up. Will call patient with follow-up appointment information once appointment is scheduled with Internal Medicine.  Time- 20 minutes

## 2019-09-01 ENCOUNTER — Encounter: Payer: No Typology Code available for payment source | Admitting: Obstetrics and Gynecology

## 2019-09-01 ENCOUNTER — Encounter: Payer: Self-pay | Admitting: Obstetrics and Gynecology

## 2019-09-09 ENCOUNTER — Encounter: Payer: Self-pay | Admitting: Internal Medicine

## 2019-09-09 ENCOUNTER — Ambulatory Visit (INDEPENDENT_AMBULATORY_CARE_PROVIDER_SITE_OTHER): Payer: Self-pay | Admitting: Internal Medicine

## 2019-09-09 ENCOUNTER — Other Ambulatory Visit: Payer: Self-pay

## 2019-09-09 DIAGNOSIS — I4510 Unspecified right bundle-branch block: Secondary | ICD-10-CM

## 2019-09-09 DIAGNOSIS — N951 Menopausal and female climacteric states: Secondary | ICD-10-CM | POA: Insufficient documentation

## 2019-09-09 DIAGNOSIS — R079 Chest pain, unspecified: Secondary | ICD-10-CM

## 2019-09-09 DIAGNOSIS — E789 Disorder of lipoprotein metabolism, unspecified: Secondary | ICD-10-CM

## 2019-09-09 DIAGNOSIS — I10 Essential (primary) hypertension: Secondary | ICD-10-CM | POA: Insufficient documentation

## 2019-09-09 DIAGNOSIS — R232 Flushing: Secondary | ICD-10-CM

## 2019-09-09 MED ORDER — HYDROCHLOROTHIAZIDE 12.5 MG PO TABS: 12.5000 mg | ORAL_TABLET | Freq: Every day | ORAL | 1 refills | Status: DC

## 2019-09-09 NOTE — Progress Notes (Signed)
   CC: high blood pressure , Wise Women program  HPI:Ms.Arrayah Connors Luan Urbani is a 49 y.o. female who presents for evaluation of hypertension and lipid panel. Please see individual problem based A/P for details.  Depression, PHQ-9: Based on the patients    Office Visit from 09/09/2019 in [facility]  PHQ-9 Total Score  6      No past medical history on file. Review of Systems:  ROS negative except as per HPI.  Physical Exam: Vitals:   09/09/19 1016  BP: (!) 149/78  Pulse: 92  Temp: 99.1 F (37.3 C)  TempSrc: Oral  SpO2: 98%  Weight: 201 lb 6.4 oz (91.4 kg)  Height: 5' (1.524 m)    General: Alert, nl appearance HE: Normocephalic, atraumatic , EOMI, Conjunctivae normal ENT: No congestion, no rhinorrhea moist, no exudate or erythema  Cardiovascular: Normal rate, regular rhythm.  No murmurs, rubs, or gallops Pulmonary : Effort normal, breath sounds normal. No wheezes, rales, or rhonchi Abdominal: soft, nontender,  bowel sounds present Musculoskeletal: no swelling , deformity, injury ,or tenderness in extremities, Skin: Warm, dry , no bruising, erythema, or rash Psychiatric/Behavioral:  normal mood, normal behavior   Assessment & Plan:   See Encounters Tab for problem based charting.  Patient discussed with Dr. Sandre Kitty

## 2019-09-09 NOTE — Assessment & Plan Note (Signed)
Patient describes chest pain since July of last year.  She asked me about a EKG that was done then.  On review of her chart she had a EKG in July when presenting to the ED.  It was thought patient was having atypical chest pain based on her description at the time.  She was tested for Covid.  EKG at the time showed incomplete right bundle branch block and computer read possible left atrial enlargement but biphasic P waves didn't meet requirements to suggest on my evaluation. Also some normal variant t wave inversions in III, aVR, and V1. No ST changes suggestive of ischemia.  -Patient symptoms today are more concerning for typical chest pain.  The pain is a pressure in her chest and sometimes radiates down her left arm. She reports pain comes on when climbing stairs or walking long distances.  Pain goes away with rest.  She has been experiencing this pain since July. She mentions losing her 63 year old son recently, but doesn't correlate thinking about her son to chest pain. She tears up during my exam.  She is not currently having chest pain.  I believe the patient would benefit from an evaluation with outpatient stress test.  She is uninsured, so I will have her meet with our financial counselor.  Once she has coverage I can send her to the cardiologist.  She was given instruction to go the emergency department if she has chest pain that radiates down her left arm again .

## 2019-09-09 NOTE — Assessment & Plan Note (Signed)
Hypertension: Patient's BP today is 149/78 with a goal of <140/80. The patient is not currently on any medication regimen.  She has been experiencing chest pain.  Her chest pain is described as typical chest pain. No Headache, visual changes, lightheadedness, weakness, dizziness on standing, swelling in the feet or ankles.   Plan: - Start HCTZ 12.5mg  - Follow-up in 4 weeks for blood pressure check and BMP

## 2019-09-09 NOTE — Progress Notes (Signed)
Internal Medicine Clinic Attending  Case discussed with Dr. Steen at the time of the visit.  We reviewed the resident's history and exam and pertinent patient test results.  I agree with the assessment, diagnosis, and plan of care documented in the resident's note.  Londan Coplen, M.D., Ph.D.  

## 2019-09-09 NOTE — Assessment & Plan Note (Signed)
The 10-year ASCVD risk score Denman George DC Montez Hageman., et al., 2013) is: 1.5%   Values used to calculate the score:     Age: 49 years     Sex: Female     Is Non-Hispanic African American: No     Diabetic: No     Tobacco smoker: No     Systolic Blood Pressure: 149 mmHg     Is BP treated: Yes     HDL Cholesterol: 62 mg/dL     Total Cholesterol: 187 mg/dL Patient does not require any cholesterol medication at this time.

## 2019-09-09 NOTE — Assessment & Plan Note (Signed)
Patient is aged 49 and questions if she could be going through menopause.  She is having hot flashes.  The hot flashes are not keeping her from her daily routine or bothering her much at this time.  Her periods are not regular for the past 6 months , occurring every other month.  Plan: - Discussed following up with her PCP if she has more concerns

## 2019-09-09 NOTE — Patient Instructions (Addendum)
Gracias por confiarme su cuidado. En resumen, hoy discutimos lo siguiente:   Alta presin sangunea -BP 149/78 Envi hidroclorotiazida, un medicamento para la presin arterial, a la farmacia ambulatoria de [employer]. Tome este medicamento una vez al da y necesito volver a verlo en la clnica en 4 semanas. En 4 semanas necesitaremos realizar anlisis de laboratorio y es posible que ajustemos su medicacin.  Dolor de pecho Describe un dolor de pecho tpico que debe evaluarse mediante una prueba de esfuerzo. Necesitar una derivacin a un cardilogo para que Metallurgist. Si su dolor de pecho regresa y no desaparece, vaya a la sala de Sports administrator.  Colesterol La puntuacin de riesgo ASCVD a 10 aos (Goff [place] Montez Hageman., et al., 2013) es: 1,1%   Valores utilizados para calcular la puntuacin:     Edad: 48 aos     Sexo: Femenino     Es afroamericano no hispano: No     Diabtico: No     Fumador de tabaco: No     Presin arterial sistlica: 149 mmHg     Se trata la BP? No     Colesterol HDL: 62 mg / dL     Colesterol total: 446 mg / dL Hoy no necesita un medicamento para el colesterol.    Dgale a la recepcin que necesita una cita con el asesor financiero.  Mi mejor,  Thurmon Fair, MD

## 2019-10-06 ENCOUNTER — Ambulatory Visit: Payer: No Typology Code available for payment source

## 2019-11-19 ENCOUNTER — Other Ambulatory Visit: Payer: Self-pay | Admitting: Internal Medicine

## 2019-12-03 NOTE — Telephone Encounter (Signed)
Spoke with the patient.  APpt sch for 12/09/2019 with [facility].

## 2019-12-09 ENCOUNTER — Ambulatory Visit (INDEPENDENT_AMBULATORY_CARE_PROVIDER_SITE_OTHER): Payer: Self-pay | Admitting: Internal Medicine

## 2019-12-09 ENCOUNTER — Other Ambulatory Visit: Payer: Self-pay

## 2019-12-09 VITALS — BP 139/98 | HR 90 | Temp 98.5°F | Ht 60.0 in | Wt 197.9 lb

## 2019-12-09 DIAGNOSIS — I10 Essential (primary) hypertension: Secondary | ICD-10-CM

## 2019-12-09 MED ORDER — HYDROCHLOROTHIAZIDE 25 MG PO TABS: 25.0000 mg | ORAL_TABLET | Freq: Every day | ORAL | 0 refills | Status: DC

## 2019-12-09 NOTE — Progress Notes (Signed)
   CC: HTN follow-up  HPI:  [name] is a 49 y.o. F with significant PMH of hypertension, who presents for follow-up. Please see problem-based charting for additional information.  Due to language barrier, an interpreter, Lawanna Kobus [phone], was present during the history-taking and subsequent discussion (and for part of the physical exam) with this patient.  Past Medical History HTN  Review of Systems:   Review of Systems  Constitutional: Negative for chills, fever and malaise/fatigue.  Eyes:       "seeing stars"  Respiratory: Negative for shortness of breath.   Cardiovascular: Negative for chest pain and palpitations.  Gastrointestinal: Negative for abdominal pain, nausea and vomiting.  Genitourinary: Negative for dysuria and urgency.  Musculoskeletal: Negative for falls.  Neurological: Positive for headaches (intermittent). Negative for sensory change and focal weakness.   Physical Exam:  Vitals:   12/09/19 1502  BP: (!) 139/98  Pulse: 90  Temp: 98.5 F (36.9 C)  TempSrc: Oral  SpO2: 98%  Weight: 197 lb 14.4 oz (89.8 kg)  Height: 5' (1.524 m)   Physical Exam Vitals and nursing note reviewed.  Constitutional:      General: She is not in acute distress.    Appearance: She is obese. She is not ill-appearing.  Cardiovascular:     Rate and Rhythm: Normal rate and regular rhythm.     Heart sounds: Normal heart sounds.  Pulmonary:     Effort: Pulmonary effort is normal. No respiratory distress.     Breath sounds: Normal breath sounds. No wheezing, rhonchi or rales.  Musculoskeletal:     Right lower leg: No edema.     Left lower leg: No edema.  Skin:    General: Skin is warm and dry.  Neurological:     Mental Status: She is alert.    Assessment & Plan:   See Encounters Tab for problem based charting.  Patient discussed with Dr. Oswaldo Done

## 2019-12-09 NOTE — Patient Instructions (Addendum)
Faith Braun,  Fue un placer conocerte!  Tu presin arterial todava estaba alta hoy. Aumente su medicamento con hidroclorotiazida a 25 mg al da. Continuar controlando la presin arterial en casa y seguimiento en la clnica en 1 mes.  Hoy comprobaremos algunos anlisis de [place] y podr [employer] durante su prxima cita.  Gracias por dejarme ser parte de su cuidado!   Hipertensin en los adultos Hypertension, Adult El trmino hipertensin es otra forma de denominar a la presin arterial elevada. La presin arterial elevada fuerza al corazn a trabajar ms para bombear la sangre. Esto puede causar problemas con el paso del [place]. Una lectura de presin arterial est compuesta por 2 nmeros. Hay un nmero superior (sistlico) sobre un nmero inferior (diastlico). Lo ideal es tener la presin arterial por debajo de 120/80. Las elecciones saludables pueden ayudar a Personal assistant presin arterial, o tal vez necesite medicamentos para bajarla. Cules son las causas? Se desconoce la causa de esta afeccin. Algunas afecciones pueden estar relacionadas con la presin arterial alta. Qu incrementa el riesgo?  Fumar.  Tener diabetes mellitus tipo 2, colesterol alto, o ambos.  No hacer la cantidad suficiente de actividad fsica o ejercicio.  Tener sobrepeso.  Consumir mucha grasa, azcar, caloras o sal (sodio) en su dieta.  Beber alcohol en exceso.  Tener una enfermedad renal a largo plazo (crnica).  Tener antecedentes familiares de presin arterial alta.  Edad. Los riesgos aumentan con la edad.  Raza. El riesgo es mayor para las Statistician.  Sexo. Antes de los 45aos, los hombres corren ms [employer]. Despus de los 65aos, las mujeres corren ms [employer].  Tener apnea obstructiva del sueo.  Estrs. Cules son los signos o los sntomas?  Es posible que la presin arterial alta puede no cause sntomas. La presin arterial muy  alta (crisis hipertensiva) puede provocar: ? Dolor de [place]. ? Sensaciones de preocupacin o nerviosismo (ansiedad). ? Falta de aire. ? Hemorragia nasal. ? Sensacin de Journalist, newspaper (nuseas). ? Vmitos. ? Cambios en la forma de ver. ? Dolor muy intenso en el pecho. ? Convulsiones. Cmo se trata?  Esta afeccin se trata haciendo cambios saludables en el estilo de vida, por ejemplo: ? Consumir alimentos saludables. ? Hacer ms ejercicio. ? Beber menos alcohol.  El mdico puede recetarle medicamentos si los cambios en el estilo de vida no son suficientes para Museum/gallery curator la presin arterial y si: ? El nmero de arriba est por encima de 130. ? El nmero de abajo est por encima de 80.  Su presin arterial personal ideal puede variar. Siga estas instrucciones en su casa: Comida y bebida   Si se lo dicen, siga el plan de alimentacin de DASH (Dietary Approaches to Stop Hypertension, Maneras de alimentarse para detener la hipertensin). Para seguir este plan: ? Llene la mitad del plato de cada comida con frutas y verduras. ? Llene un cuarto del plato de cada comida con cereales integrales. Los cereales integrales incluyen pasta integral, arroz integral y pan integral. ? Coma y beba productos lcteos con bajo contenido de grasa, como leche descremada o yogur bajo en grasas. ? Llene un cuarto del plato de cada comida con protenas bajas en grasa (magras). Las protenas bajas en grasa incluyen pescado, pollo sin piel, huevos, frijoles y tofu. ? Evite consumir carne grasa, carne curada y procesada, o pollo con piel. ? Evite consumir alimentos prehechos o procesados.  Consuma menos de 1500 mg de sal por  da.  No beba alcohol si: ? El mdico le indica que no lo haga. ? Est embarazada, puede estar embarazada o est tratando de quedar embarazada.  Si bebe alcohol: ? Limite la cantidad que bebe a lo siguiente:  De 0 a 1 medida por da para las mujeres.  De 0 a 2  medidas por da para los hombres. ? Est atento a la cantidad de alcohol que hay en las bebidas que toma. En los [place], una medida equivale a una botella de cerveza de 12oz (344ml), un vaso de vino de 5oz (141ml) o un vaso de una bebida alcohlica de alta graduacin de 1oz (71ml). Estilo de vida   Trabaje con su mdico para mantenerse en un peso saludable o para perder peso. Pregntele a su mdico cul es el peso recomendable para usted.  Haga al menos 57minutos de ejercicio la [employer] de la [place]. Estos pueden incluir caminar, nadar o andar en bicicleta.  Realice al menos 30 minutos de ejercicio que fortalezca sus msculos (ejercicios de resistencia) al menos 3 das a la [place]. Estos pueden incluir levantar pesas o hacer Pilates.  No consuma ningn producto que contenga nicotina o tabaco, como cigarrillos, cigarrillos electrnicos y tabaco de Higher education careers adviser. Si necesita ayuda para dejar de fumar, consulte al [employer].  Controle su presin arterial en su casa tal como le indic el mdico.  Concurra a todas las visitas de seguimiento como se lo haya indicado el mdico. Esto es importante. Medicamentos  [employer] de venta libre y los recetados solamente como se lo haya indicado el mdico. Siga cuidadosamente las indicaciones.  No omita las dosis de medicamentos para la presin arterial. Los medicamentos pierden eficacia si omite dosis. El hecho de omitir las dosis tambin [place] el riesgo de otros problemas.  Pregntele a su mdico a qu efectos secundarios o reacciones a los Careers information officer. Comunquese con un mdico si:  Piensa que tiene [place] reaccin a los medicamentos que est tomando.  Tiene dolores de cabeza frecuentes (recurrentes).  Se siente mareado.  Tiene hinchazn en los tobillos.  Tiene problemas de visin. Solicite ayuda inmediatamente si:  Siente un dolor de cabeza muy intenso.  Empieza a sentirse desorientado  (confundido).  Se siente dbil o adormecido.  Siente que va a desmayarse.  Tiene un dolor muy intenso en las siguientes zonas: ? Pecho. ? Vientre (abdomen).  Vomita ms de una vez.  Tiene dificultad para respirar. Resumen  El trmino hipertensin es otra forma de denominar a la presin arterial elevada.  La presin arterial elevada fuerza al corazn a trabajar ms para bombear la sangre.  Para la [employer], una presin arterial normal es menor que 120/80.  Las decisiones saludables pueden ayudarle a disminuir su presin arterial. Si no puede bajar su presin arterial mediante decisiones saludables, es posible que deba tomar medicamentos. Esta informacin no tiene Marine scientist el consejo del mdico. Asegrese de hacerle al mdico cualquier pregunta que tenga. Document Revised: 04/02/2018 Document Reviewed: 04/02/2018 Elsevier Patient Education  [place].

## 2019-12-09 NOTE — Assessment & Plan Note (Addendum)
Pt presents today for follow-up of her blood pressure. States she feels like it is getting better. Checks her BP intermittently at home with systolic readings in the 140-150s (cannot remember diastolic measurements). Endorses sporadic headaches, once every 3 months or so. Will occastionally "see stars" when standing from a seated position or changing positions. At her last appointment, pt was having chest pain when climbing the stairs, but states today that this hasn't happened in months. She works at [employer] and is able to complete entire busy shifts on her feet without chest pain or other symptoms. Has been taking HCTZ 12.5mg  daily. Endorses daily adherence, and denies medication side effects.   BP today remains elevated at 139/98.  Assessment - essential hypertension  - increase HCTZ to 25mg  daily - provided education regarding BP goals and effects of long-standing hypertension - encouraged lifestyle modifications with weight loss, diet, and exercise - BMP today - BP check in 1 month  BP Readings from Last 3 Encounters:  12/09/19 (!) 139/98  09/09/19 (!) 149/78  08/16/19 (!) 146/90

## 2019-12-10 LAB — BMP8+ANION GAP
Anion Gap: 15 mmol/L (ref 10.0–18.0)
BUN/Creatinine Ratio: 36 — ABNORMAL HIGH (ref 9–23)
BUN: 17 mg/dL (ref 6–24)
CO2: 23 mmol/L (ref 20–29)
Calcium: 9.7 mg/dL (ref 8.7–10.2)
Chloride: 102 mmol/L (ref 96–106)
Creatinine, Ser: 0.47 mg/dL — ABNORMAL LOW (ref 0.57–1.00)
GFR calc Af Amer: 135 mL/min/{1.73_m2} (ref >59)
GFR calc non Af Amer: 117 mL/min/{1.73_m2} (ref >59)
Glucose: 91 mg/dL (ref 65–99)
Potassium: 4.1 mmol/L (ref 3.5–5.2)
Sodium: 140 mmol/L (ref 134–144)

## 2019-12-10 NOTE — Progress Notes (Signed)
Internal Medicine Clinic Attending  Case discussed with Dr. Jones at the time of the visit.  We reviewed the resident's history and exam and pertinent patient test results.  I agree with the assessment, diagnosis, and plan of care documented in the resident's note.  

## 2020-01-14 ENCOUNTER — Other Ambulatory Visit (HOSPITAL_COMMUNITY): Payer: Self-pay | Admitting: Student

## 2020-01-14 ENCOUNTER — Other Ambulatory Visit: Payer: Self-pay | Admitting: Internal Medicine

## 2020-06-07 ENCOUNTER — Telehealth: Payer: Self-pay

## 2020-06-07 NOTE — Telephone Encounter (Signed)
Called patient to do [facility] 3 for [facility] via Natale Lay, [place]. Patient was at work. Will try to call back tomorrow, 06/07/2020 after 3.

## 2020-06-21 ENCOUNTER — Telehealth: Payer: Self-pay

## 2020-06-21 NOTE — Telephone Encounter (Signed)
Left message for patient about completing [facility] 3 for the [employer] program. Left name and number for patient to call back. 

## 2020-09-01 ENCOUNTER — Other Ambulatory Visit (HOSPITAL_COMMUNITY): Payer: Self-pay | Admitting: Internal Medicine

## 2020-09-01 ENCOUNTER — Other Ambulatory Visit: Payer: Self-pay | Admitting: Student

## 2020-09-01 NOTE — Telephone Encounter (Signed)
Please arrange a hypertension follow up appointment with her. 30d supply of hctz provided but she needs an appointment prior to further refills after that

## 2020-09-04 ENCOUNTER — Encounter: Payer: Self-pay | Admitting: Student

## 2020-09-04 NOTE — Telephone Encounter (Signed)
Spoke with the patient.  She sch an appt for 09/25/2020 @ 9:15 am.

## 2020-09-23 NOTE — Progress Notes (Signed)
Office Visit   Patient ID: Faith Braun, female    DOB: 1971-01-10, 50 y.o.   MRN: [id number]  Subjective:  CC: chronic hypertension, right arm pain, hot flashes and depressed mood  Faith Braun is a 50 y.o. year old female who presents for the above conditions.  Right arm pain Pain was initially localized to the hand however has progressed up her arm over the past months. It is described as a sharp sting that comes and goes. Pain is worse at night and is associated with numbness of her 3-5th digits. It improves with shaking out her hands. She denies weakness of the hand.  She works at [employer] which requires her to do repetitive motions with her hands.  No prior similar symptoms.  Hot flashes She associates these with menopause and has been experiencing them for around 2 years. They are bothersome to her and she wishes to know if there is medication she can take for it.  Depressed mood She also notes that she has been experiencing worsening sadness and tearfulness over the past year. She notes that she is constantly fatigued and does not feel like doing things that she used to find joy in.  She denies prior similar symptoms. She denies thoughts of harming herself.  An interpreter was used for the entirety of the visit.      ACTIVE MEDICATIONS   Outpatient Medications Prior to Visit  Medication Sig Dispense Refill  . albuterol (VENTOLIN HFA) 108 (90 Base) MCG/ACT inhaler Inhale 1-2 puffs into the lungs every 6 (six) hours as needed for wheezing or shortness of breath. (Patient not taking: Reported on 07/29/2019) 18 g 0  . benzonatate (TESSALON) 100 MG capsule Take 1-2 capsules (100-200 mg total) by mouth 3 (three) times daily as needed. (Patient not taking: Reported on 07/29/2019) 60 capsule 0  . hydrochlorothiazide (HYDRODIURIL) 25 MG tablet TAKE 1 TABLET (25 MG TOTAL) BY MOUTH DAILY. 30 tablet 0  . ibuprofen (ADVIL,MOTRIN) 600 MG tablet Take 600  mg by mouth every 6 (six) hours as needed. Toe pain      No facility-administered medications prior to visit.     Objective:   BP 131/63 (BP Location: Left Arm, Patient Position: Sitting, Cuff Size: Normal)   Pulse 64   Wt 193 lb 3.2 oz (87.6 kg)   SpO2 98%   BMI 37.73 kg/m  Wt Readings from Last 3 Encounters:  09/25/20 193 lb 3.2 oz (87.6 kg)  12/09/19 197 lb 14.4 oz (89.8 kg)  09/09/19 201 lb 6.4 oz (91.4 kg)   BP Readings from Last 3 Encounters:  09/25/20 131/63  12/09/19 (!) 139/98  09/09/19 (!) 149/78   Physical exam General: well appearing in NAD Psych: intermittently tearful, flat affect. Denies thoughts of harming herself. Normal thought content.  Health Maintenance:   Health Maintenance  Topic Date Due  . Hepatitis C Screening  Never done  . COVID-19 Vaccine (1) Never done  . TETANUS/TDAP  Never done  . COLONOSCOPY (Pts 45-65yrs Insurance coverage will need to be confirmed)  Never done  . INFLUENZA VACCINE  Never done  . PAP SMEAR-Modifier  07/28/2022  . HIV Screening  Completed  . HPV VACCINES  Aged Out     Assessment & Plan:   Problem List Items Addressed This Visit      Cardiovascular and Mediastinum   Hypertension (Chronic)    Current medications: hctz 25mg  daily Denies adverse medication effects. Blood pressure is at  goal in the office today BP Readings from Last 3 Encounters:  09/25/20 131/63  12/09/19 (!) 139/98  09/09/19 (!) 149/78   Plan -continue current management -obtain labs at time of follow up in 4w      Relevant Medications   hydrochlorothiazide (HYDRODIURIL) 25 MG tablet     Nervous and Auditory   Carpal tunnel syndrome    Pt is seeking evaluation of right arm pain associated with numbness and tingling of digits 3-5 on her right hand. History and exam are consistent with carpal tunnel syndrome.  Risk factors include her weight and work detail. She is not having weakness to necessitate immediate referral to hand  surgery Plan -wrist splint QHS -discussed work modifications -f/u if symptoms do not resolve      Relevant Medications   escitalopram (LEXAPRO) 10 MG tablet     Other   Perimenopausal symptoms (Chronic)    Pt is seeking management of hot flashes x1Y.  Plan -start lexapro 10mg  -f/u in 4w at which time she may titrate up to 20mg       Moderate major depression, single episode (HCC) (Chronic)    Pt endorses sadness, frequent tearfulness, anhedonia, low energy, poor concentration x2 years. Denies thoughts of harming herself.  PHQ score is 7 today however this seems incongruent with her expressed symptoms.  Plan -start lexapro 10mg  -f/u in 4 weeks for re-evaluation of symptoms--may titrate up to 20mg  at that time      Relevant Medications   escitalopram (LEXAPRO) 10 MG tablet       Return in about 4 weeks (around 10/23/2020) for depression and carpal tunnel recheck.   Pt discussed with Dr. , MD Internal Medicine Resident PGY-2 Internal Medicine Residency Pager: [phone] 09/25/2020 1:19 PM

## 2020-09-25 ENCOUNTER — Encounter: Payer: Self-pay | Admitting: Internal Medicine

## 2020-09-25 ENCOUNTER — Ambulatory Visit: Payer: Self-pay | Admitting: Internal Medicine

## 2020-09-25 DIAGNOSIS — G5601 Carpal tunnel syndrome, right upper limb: Secondary | ICD-10-CM

## 2020-09-25 DIAGNOSIS — G5600 Carpal tunnel syndrome, unspecified upper limb: Secondary | ICD-10-CM | POA: Insufficient documentation

## 2020-09-25 DIAGNOSIS — I10 Essential (primary) hypertension: Secondary | ICD-10-CM

## 2020-09-25 DIAGNOSIS — N951 Menopausal and female climacteric states: Secondary | ICD-10-CM

## 2020-09-25 DIAGNOSIS — F321 Major depressive disorder, single episode, moderate: Secondary | ICD-10-CM

## 2020-09-25 MED ORDER — ESCITALOPRAM OXALATE 10 MG PO TABS: 10.0000 mg | ORAL_TABLET | Freq: Every day | ORAL | 2 refills | Status: DC

## 2020-09-25 MED ORDER — HYDROCHLOROTHIAZIDE 25 MG PO TABS: 25.0000 mg | ORAL_TABLET | Freq: Every day | ORAL | 0 refills | Status: DC

## 2020-09-25 NOTE — Assessment & Plan Note (Signed)
Pt is seeking evaluation of right arm pain associated with numbness and tingling of digits 3-5 on her right hand. History and exam are consistent with carpal tunnel syndrome.  Risk factors include her weight and work detail. She is not having weakness to necessitate immediate referral to hand surgery Plan -wrist splint QHS -discussed work modifications -f/u if symptoms do not resolve

## 2020-09-25 NOTE — Assessment & Plan Note (Signed)
Current medications: hctz 25mg  daily Denies adverse medication effects. Blood pressure is at goal in the office today BP Readings from Last 3 Encounters:  09/25/20 131/63  12/09/19 (!) 139/98  09/09/19 (!) 149/78   Plan -continue current management -obtain labs at time of follow up in 4w

## 2020-09-25 NOTE — Assessment & Plan Note (Signed)
Pt is seeking management of hot flashes x1Y.  Plan -start lexapro 10mg  -f/u in 4w at which time she may titrate up to 20mg 

## 2020-09-25 NOTE — Patient Instructions (Signed)
Sndrome del tnel carpiano Carpal Tunnel Syndrome  El sndrome del tnel carpiano es una afeccin que causa dolor, adormecimiento y debilidad en la mano y los dedos. El tnel carpiano es un rea estrecha ubicada en el lado palmar de la [place]. Los movimientos repetidos de la mueca o determinadas enfermedades pueden causar la hinchazn del tnel. Esta hinchazn comprime el nervio principal de la [place]. El nervio principal de la [place] se llama "nervio mediano". Cules son las causas? Esta afeccin puede ser causada por lo siguiente:  Movimientos repetidos y enrgicos de la [place] y Engineer, site.  Lesiones en la [place].  Artritis.  Un quiste o un tumor en el tnel carpiano.  Acumulacin de lquido Academic librarian.  Uso de herramientas que vibran. A veces, se desconoce la causa de esta afeccin. Qu incrementa el riesgo? Los siguientes factores pueden hacer que sea ms propenso a Aeronautical engineer afeccin:  Tener un trabajo que requiera que mueva la mueca o la mano repetitivamente o enrgicamente o que utilice herramientas que vibran. Estos pueden ser, [employer], los trabajos que implican usar una computadora, trabajar en una lnea de ensamblaje o trabajar con herramientas elctricas como taladros y lijadoras.  Ser mujer.  Tener ciertas afecciones, tales como: ? Diabetes. ? Obesidad. ? Tiroides hipoactiva (hipotiroidismo). ? Insuficiencia renal. ? Artritis reumatoide. Cules son los signos o sntomas? Los sntomas de esta afeccin incluyen:  Sensacin de hormigueo en los dedos de la mano, especialmente el pulgar, el ndice y el dedo [place].  Hormigueo o adormecimiento en la mano.  Sensacin de Engineer, mining en todo el brazo, especialmente cuando la [place] y el codo estn flexionados durante [place].  Dolor en la mueca que sube por el brazo hasta el hombro.  Dolor que baja hasta la palma de la mano o los dedos.  Sensacin de Marathon Oil. Tal vez tenga  dificultad para tomar y Personal assistant. Los sntomas pueden empeorar durante la noche. Cmo se diagnostica? Esta afeccin se diagnostica mediante los antecedentes mdicos y un examen fsico. Tambin pueden hacerle estudios, que incluyen los siguientes:  Un electromiograma (EMG). Esta prueba mide las seales elctricas que los nervios les envan a los msculos.  Estudio de [employer]. Este estudio permite determinar si las seales elctricas pasan correctamente por los nervios.  Estudios de diagnstico por imgenes, como radiografas, una ecografa y Hotel manager (RM). Estos estudios Education officer, community las posibles causas de la afeccin. Cmo se trata? El tratamiento de esta afeccin puede incluir:  Cambios en el estilo de vida. Es importante que deje o cambie la actividad que caus la afeccin.  Hacer ejercicio y actividades para fortalecer y Furniture conservator/restorer los msculos y los tendones (fisioterapia).  Hacer cambios en el estilo de vida que lo ayuden con su afeccin y aprender a Education officer, environmental sus actividades diarias de forma segura (terapia ocupacional).  Analgsicos y antiinflamatorios. Esto puede incluir medicamentos que se inyectan en la [place].  Una frula o un dispositivo ortopdico para la [place].  [place]. Siga estas instrucciones en su casa: Si tiene una frula o un dispositivo ortopdico:  Use la frula o el dispositivo ortopdico como se lo haya indicado el mdico. Quteselos solamente como se lo haya indicado el mdico.  Afloje la frula o el dispositivo ortopdico si los dedos de las manos se le adormecen, siente hormigueos o se le enfran y se tornan de Research officer, trade union.  Mantenga la frula o el dispositivo ortopdico limpios.  Si la frula o el dispositivo ortopdico  no son impermeables: ? No deje que se mojen. ? Cbralos con un envoltorio hermtico cuando tome un bao de inmersin o una ducha. Control del dolor, la rigidez y la hinchazn Si se lo indican,  aplique hielo sobre la zona dolorida. Para hacer esto:  Si tiene una frula o un dispositivo ortopdico desmontable, quteselos como se lo haya indicado el mdico.  Ponga el hielo en una bolsa plstica.  Coloque una FirstEnergy Corp piel y la [place], o entre la frula o dispositivo ortopdico y Copy.  Aplique el hielo durante , 2 o 3veces por da. No se quede dormido con la bolsa de hielo International [employer].  Retire el hielo si la piel se pone de color rojo brillante. Esto es Intel. Si no puede sentir dolor, calor o fro, tiene un mayor riesgo de que se dae la zona. Mueva los dedos con frecuencia para reducir la rigidez y la hinchazn.   Instrucciones generales  Use los medicamentos de venta libre y los recetados solamente como se lo haya indicado el mdico.  Descanse la [place] y la mano de toda actividad que le cause dolor. Si la afeccin tiene relacin con Kathie Dike, hable con su empleador Tribune Company pueden [place], por [place], usar una almohadilla para apoyar la mueca mientras tipea.  Haga los ejercicios como se lo hayan indicado el mdico, el fisioterapeuta o el terapeuta ocupacional.  Cumpla con todas las visitas de seguimiento. Esto es importante. Comunquese con un mdico si:  Aparecen nuevos sntomas.  El dolor no se alivia con los [employer].  Sus sntomas empeoran. Solicite ayuda de inmediato si:  Tiene hormigueo o adormecimiento intensos en la mueca o la mano. Resumen  El sndrome del tnel carpiano es una afeccin que causa dolor, adormecimiento y debilidad en la mano y los dedos.  Generalmente se debe a movimientos repetidos de [employer].  El sndrome del tnel carpiano se trata mediante cambios en el estilo de vida y medicamentos. Tambin puede indicarse la [place].  Siga las instrucciones del mdico sobre el uso de una frula, el descanso de la [place], la asistencia a las visitas de seguimiento y Freight forwarder para pedir  ayuda. Esta informacin no tiene Theme park manager el consejo del mdico. Asegrese de hacerle al mdico cualquier pregunta que tenga. Document Revised: 12/03/2019 Document Reviewed: 12/03/2019 Elsevier Patient Education  2021 [employer].

## 2020-09-25 NOTE — Assessment & Plan Note (Signed)
Pt endorses sadness, frequent tearfulness, anhedonia, low energy, poor concentration x2 years. Denies thoughts of harming herself.  PHQ score is 7 today however this seems incongruent with her expressed symptoms.  Plan -start lexapro 10mg  -f/u in 4 weeks for re-evaluation of symptoms--may titrate up to 20mg  at that time

## 2020-09-27 NOTE — Progress Notes (Signed)
Internal Medicine Clinic Attending  Case discussed with Dr. Christian  At the time of the visit.  We reviewed the resident's history and exam and pertinent patient test results.  I agree with the assessment, diagnosis, and plan of care documented in the resident's note.  

## 2020-10-10 ENCOUNTER — Other Ambulatory Visit: Payer: Self-pay | Admitting: Internal Medicine

## 2020-10-10 ENCOUNTER — Other Ambulatory Visit (HOSPITAL_COMMUNITY): Payer: Self-pay

## 2020-10-13 ENCOUNTER — Other Ambulatory Visit (HOSPITAL_COMMUNITY): Payer: Self-pay

## 2020-10-13 ENCOUNTER — Other Ambulatory Visit: Payer: Self-pay | Admitting: Internal Medicine

## 2020-10-16 ENCOUNTER — Other Ambulatory Visit (HOSPITAL_COMMUNITY): Payer: Self-pay

## 2020-10-16 MED ORDER — HYDROCHLOROTHIAZIDE 25 MG PO TABS
25.0000 mg | ORAL_TABLET | Freq: Every day | ORAL | 4 refills | Status: DC
  Filled 2020-10-16 (×2): qty 30, 30d supply, fill #0

## 2020-10-24 ENCOUNTER — Other Ambulatory Visit (HOSPITAL_COMMUNITY): Payer: Self-pay

## 2021-04-16 ENCOUNTER — Encounter: Payer: Self-pay | Admitting: Internal Medicine

## 2021-04-18 ENCOUNTER — Other Ambulatory Visit: Payer: Self-pay

## 2021-04-18 DIAGNOSIS — Z1231 Encounter for screening mammogram for malignant neoplasm of breast: Secondary | ICD-10-CM

## 2021-06-05 ENCOUNTER — Inpatient Hospital Stay: Admission: RE | Admit: 2021-06-05 | Payer: Self-pay | Source: Ambulatory Visit

## 2021-06-05 ENCOUNTER — Ambulatory Visit: Payer: Self-pay

## 2021-07-18 ENCOUNTER — Encounter: Payer: Self-pay | Admitting: Internal Medicine

## 2021-07-18 ENCOUNTER — Other Ambulatory Visit: Payer: Self-pay

## 2021-07-18 ENCOUNTER — Ambulatory Visit: Payer: Self-pay | Admitting: Internal Medicine

## 2021-07-18 ENCOUNTER — Other Ambulatory Visit (HOSPITAL_COMMUNITY): Payer: Self-pay

## 2021-07-18 VITALS — BP 145/59 | HR 68 | Temp 98.0°F | Wt 201.9 lb

## 2021-07-18 DIAGNOSIS — N951 Menopausal and female climacteric states: Secondary | ICD-10-CM

## 2021-07-18 DIAGNOSIS — F321 Major depressive disorder, single episode, moderate: Secondary | ICD-10-CM

## 2021-07-18 DIAGNOSIS — Z6839 Body mass index (BMI) 39.0-39.9, adult: Secondary | ICD-10-CM

## 2021-07-18 DIAGNOSIS — E789 Disorder of lipoprotein metabolism, unspecified: Secondary | ICD-10-CM

## 2021-07-18 DIAGNOSIS — I10 Essential (primary) hypertension: Secondary | ICD-10-CM

## 2021-07-18 DIAGNOSIS — Z599 Problem related to housing and economic circumstances, unspecified: Secondary | ICD-10-CM

## 2021-07-18 DIAGNOSIS — Z0000 Encounter for general adult medical examination without abnormal findings: Secondary | ICD-10-CM

## 2021-07-18 DIAGNOSIS — Z139 Encounter for screening, unspecified: Secondary | ICD-10-CM | POA: Insufficient documentation

## 2021-07-18 MED ORDER — HYDROCHLOROTHIAZIDE 25 MG PO TABS
25.0000 mg | ORAL_TABLET | Freq: Every day | ORAL | 4 refills | Status: DC
  Filled 2021-07-18: qty 30, 30d supply, fill #0
  Filled 2021-09-06: qty 30, 30d supply, fill #1
  Filled 2021-10-18: qty 30, 30d supply, fill #2
  Filled 2021-11-28: qty 30, 30d supply, fill #3
  Filled 2022-01-02: qty 30, 30d supply, fill #4

## 2021-07-18 MED ORDER — ESCITALOPRAM OXALATE 10 MG PO TABS
10.0000 mg | ORAL_TABLET | Freq: Every day | ORAL | 2 refills | Status: DC
  Filled 2021-07-18: qty 30, 30d supply, fill #0

## 2021-07-18 NOTE — Assessment & Plan Note (Signed)
She is due for multiple routine screenings and vaccinations however she declines these at her visit today due to financial barriers.

## 2021-07-18 NOTE — Assessment & Plan Note (Signed)
PHQ-9 score is 6 today. Not currently on medical therapy. Lexapro will be resumed today for management of hot flashes.

## 2021-07-18 NOTE — Progress Notes (Signed)
° °  Office Visit   Patient ID: Faith Braun, female    DOB: Jan 06, 1971, 51 y.o.   MRN: [id number]   PCP: Elige Radon, MD   Subjective:   Faith Braun is a 51 y.o. year old femaleemale who presents for hypertension follow up.   Objective:   BP (!) 145/59 (BP Location: Left Arm, Patient Position: Sitting, Cuff Size: Large)    Pulse 68    Temp 98 F (36.7 C) (Oral)    Wt 201 lb 14.4 oz (91.6 kg)    LMP 10/04/2019    SpO2 97%    BMI 39.43 kg/m  General: obese, well appearing, no distress Cardiac: RRR Pulm: lungs clear throughout Psych: normal mood and affect Assessment & Plan:   Problem List Items Addressed This Visit       Cardiovascular and Mediastinum   Hypertension - Primary (Chronic)    Blood pressure is above goal in the office today--145/59. Previously treated with hctz however she has not been taking it for about 6 months as the pharmacy told her that she needed an office visit prior to further refills. Resume hctz 25mg  daily Check bmp today F/u 4w for b/p recheck      Relevant Medications   hydrochlorothiazide (HYDRODIURIL) 25 MG tablet   Other Relevant Orders   Basic metabolic panel     Other   Obesity, unspecified (Chronic)    BMI 39.4. Would benefit from weight loss. She has had issues with adherence to hctz and lexapro so I do not think additional pharmacotherapy would be beneficial at this point. This is something we can consider revisiting if adherence issues are resolved in the future.  Would benefit for diabetic screening if financially feasible for her in the future.       Perimenopausal symptoms (Chronic)    She has not been taking lexapro for about 6 months. She does not remember ever having started it despite our conversation back in March about it.  lexapro resent to the pharmacy F/u in 4w at which time we can consider titrating up to 20mg       Moderate major depression, single episode (HCC) (Chronic)    PHQ-9 score is  6 today. Not currently on medical therapy. Lexapro will be resumed today for management of hot flashes.       Relevant Medications   escitalopram (LEXAPRO) 10 MG tablet   Borderline high cholesterol    Will need to check cholesterol upon completion of CAFA letter.      Healthcare maintenance    She is due for multiple routine screenings and vaccinations however she declines these at her visit today due to financial barriers.       Financial difficulty    Currently uninsured and has not completed orange card/CAFA letter application. Financial barriers are precluding routine screening and vaccinations. She was instructed to obtain the application and work on completing it over the next few weeks.       Return in about 4 weeks (around 08/15/2021) for Blood pressure follow up. BMP Follow up orange card/CAFA letter process and obtain the following if completed Lipid panel, screening A1C, update vaccinations and health maintenance items   Pt discussed with Dr. , MD Internal Medicine Resident PGY-3 08/17/2021 Internal Medicine Residency 07/18/2021 9:53 AM

## 2021-07-18 NOTE — Assessment & Plan Note (Signed)
Will need to check cholesterol upon completion of CAFA letter.

## 2021-07-18 NOTE — Assessment & Plan Note (Signed)
She has not been taking lexapro for about 6 months. She does not remember ever having started it despite our conversation back in March about it.   lexapro resent to the pharmacy  F/u in 4w at which time we can consider titrating up to 20mg 

## 2021-07-18 NOTE — Assessment & Plan Note (Addendum)
BMI 39.4. Would benefit from weight loss. She has had issues with adherence to hctz and lexapro so I do not think additional pharmacotherapy would be beneficial at this point. This is something we can consider revisiting if adherence issues are resolved in the future.  Would benefit for diabetic screening if financially feasible for her in the future.

## 2021-07-18 NOTE — Assessment & Plan Note (Signed)
Currently uninsured and has not completed orange card/CAFA letter application. Financial barriers are precluding routine screening and vaccinations. She was instructed to obtain the application and work on completing it over the next few weeks.

## 2021-07-18 NOTE — Assessment & Plan Note (Signed)
Blood pressure is above goal in the office today--145/59. Previously treated with hctz however she has not been taking it for about 6 months as the pharmacy told her that she needed an office visit prior to further refills.  Resume hctz 25mg  daily  Check bmp today  F/u 4w for b/p recheck

## 2021-07-19 LAB — BASIC METABOLIC PANEL
BUN/Creatinine Ratio: 37 — ABNORMAL HIGH (ref 9–23)
BUN: 19 mg/dL (ref 6–24)
CO2: 25 mmol/L (ref 20–29)
Calcium: 9.3 mg/dL (ref 8.7–10.2)
Chloride: 104 mmol/L (ref 96–106)
Creatinine, Ser: 0.52 mg/dL — ABNORMAL LOW (ref 0.57–1.00)
Glucose: 107 mg/dL — ABNORMAL HIGH (ref 70–99)
Potassium: 4.1 mmol/L (ref 3.5–5.2)
Sodium: 143 mmol/L (ref 134–144)
eGFR: 113 mL/min/{1.73_m2} (ref >59)

## 2021-07-25 NOTE — Progress Notes (Signed)
Internal Medicine Clinic Attending  Case discussed with Dr. Christian  At the time of the visit.  We reviewed the resident's history and exam and pertinent patient test results.  I agree with the assessment, diagnosis, and plan of care documented in the resident's note.  

## 2021-08-15 ENCOUNTER — Encounter: Payer: Self-pay | Admitting: Student

## 2021-08-15 IMAGING — MG MM DIGITAL DIAGNOSTIC UNILAT*L* W/ TOMO W/ CAD
4 series · 4 of 12 positions shown · non-contrast
Comparison: Previous exam(s).

CLINICAL DATA: Patient was called back from screening mammogram for
a left breast mass.

EXAM:
DIGITAL DIAGNOSTIC LEFT MAMMOGRAM WITH TOMO
ULTRASOUND LEFT BREAST

[L MLO synth-2D]
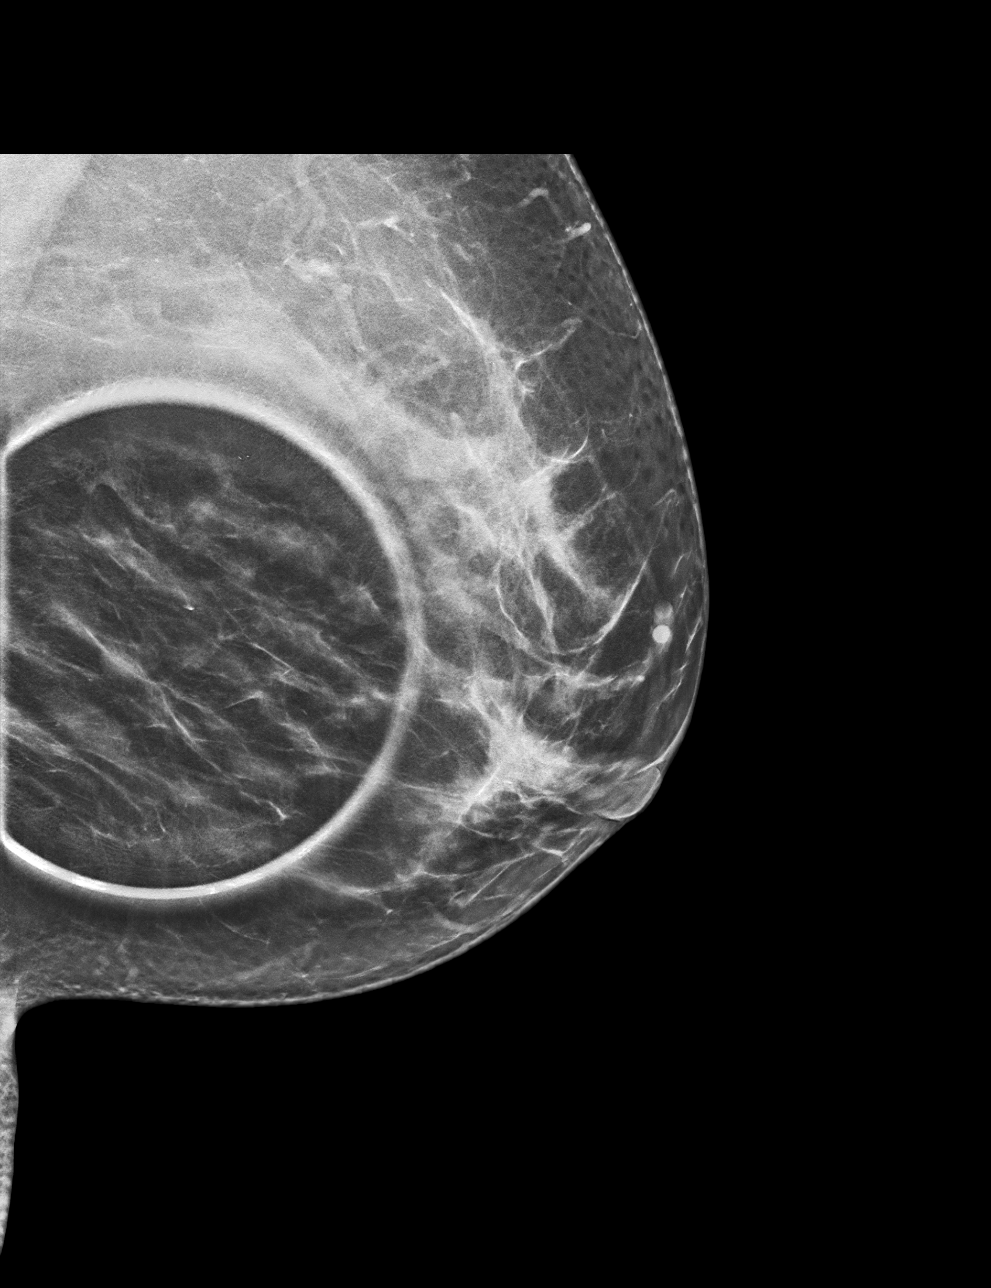

[L CC synth-2D]
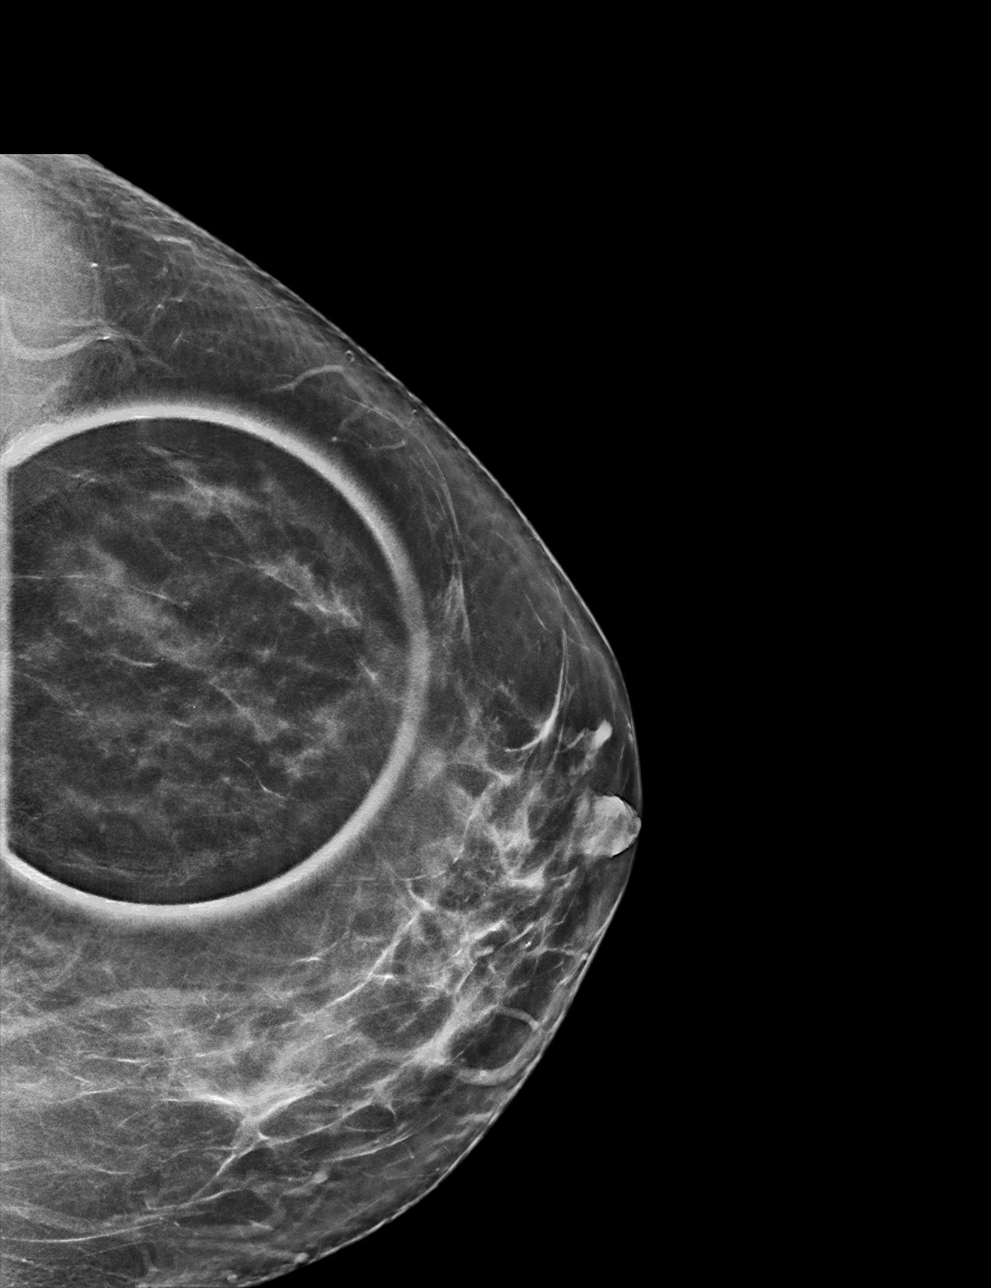

[L MLO tomo · tomo slice 35/70.0]
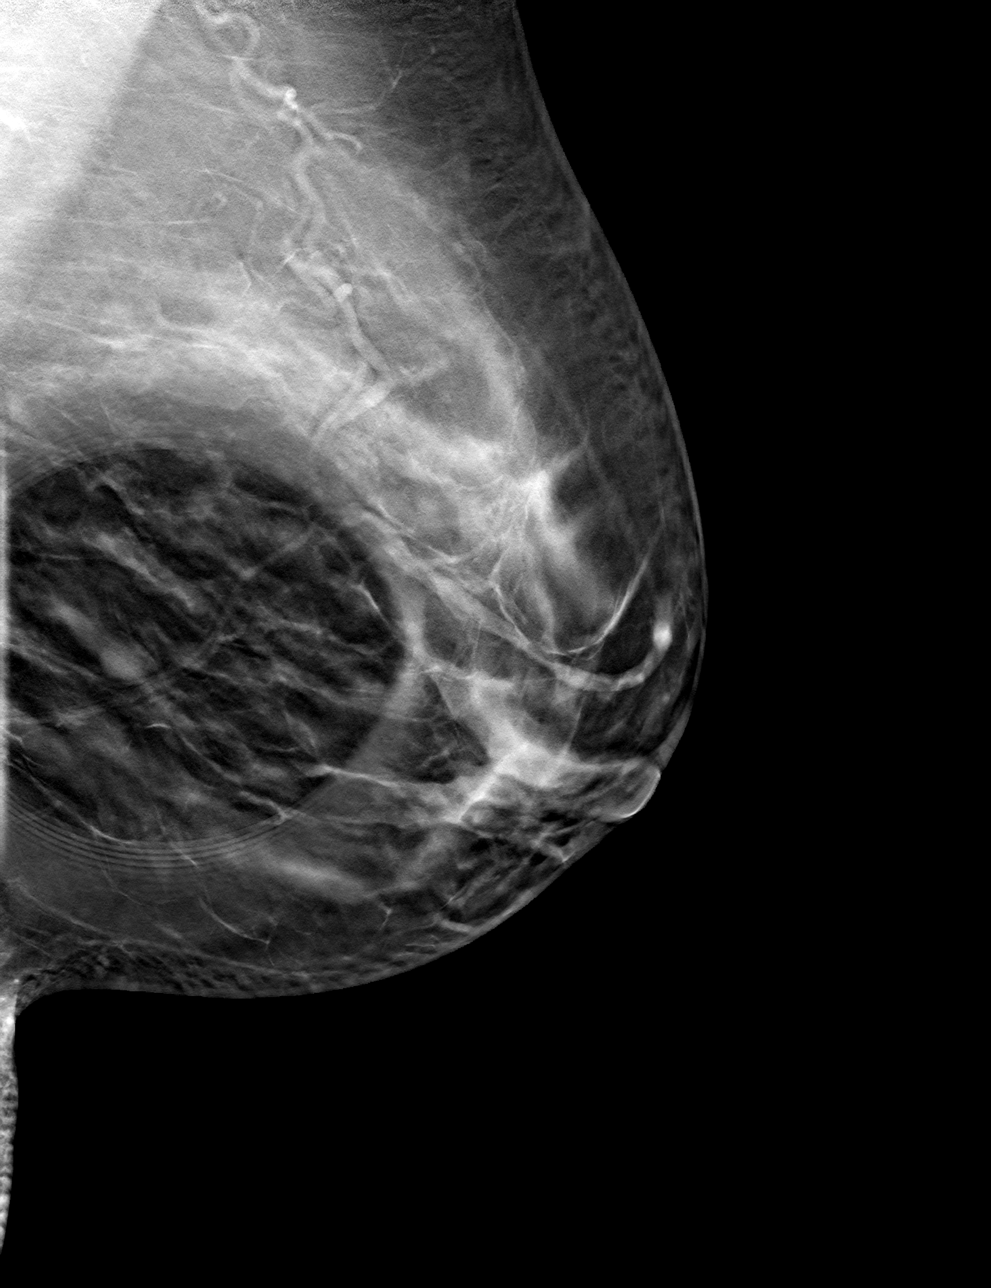

[L CC tomo · tomo slice 37/74.0]
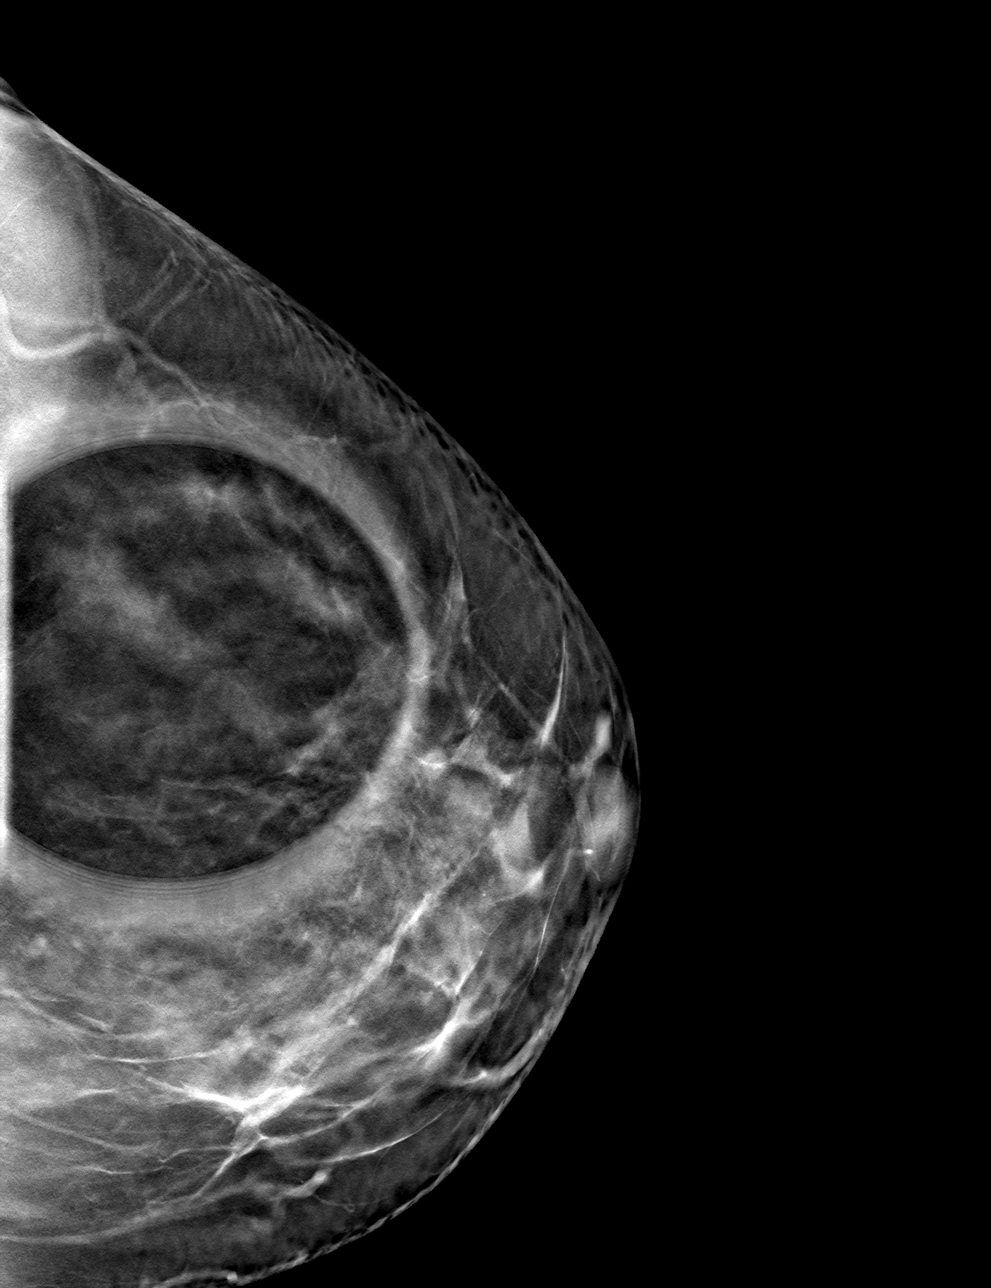

[4 of 12 positions shown; findings below may reference images not displayed]

ACR Breast Density Category c: The breast tissue is heterogeneously
dense, which may obscure small masses.
FINDINGS: Additional imaging of the left breast was performed. There is
persistence of a well-circumscribed 9 mm mass in the lateral aspect
of the breast.

Targeted ultrasound is performed, showing a well-circumscribed
anechoic cyst in the left breast at 4 o'clock 4 cm from the nipple
measuring 8 x 6 x 6 mm. There is an additional smaller cyst at 4
o'clock 4 cm from the nipple measuring 5 x 2 x 4 mm.
IMPRESSION: Right breast cysts.  No evidence of malignancy in the right breast.

RECOMMENDATION:
Bilateral screening mammogram in 1 year is recommended.

I have discussed the findings and recommendations with the patient.
If applicable, a reminder letter will be sent to the patient
regarding the next appointment.

BI-RADS CATEGORY  2: Benign.

## 2021-09-06 ENCOUNTER — Other Ambulatory Visit (HOSPITAL_COMMUNITY): Payer: Self-pay

## 2021-09-13 ENCOUNTER — Encounter: Payer: Self-pay | Admitting: Internal Medicine

## 2021-09-13 ENCOUNTER — Other Ambulatory Visit: Payer: Self-pay

## 2021-09-13 ENCOUNTER — Ambulatory Visit: Payer: Self-pay | Admitting: Internal Medicine

## 2021-09-13 DIAGNOSIS — N951 Menopausal and female climacteric states: Secondary | ICD-10-CM

## 2021-09-13 DIAGNOSIS — I10 Essential (primary) hypertension: Secondary | ICD-10-CM

## 2021-09-13 DIAGNOSIS — R0683 Snoring: Secondary | ICD-10-CM

## 2021-09-13 NOTE — Patient Instructions (Addendum)
It was nice seeing you today! Thank you for choosing [facility] for your Primary Care.  ?  ?Today we talked about:  ? ?High blood pressure: Your blood pressure today is much better! Continue taking Hydrochlorothiazide daily.  ? ?Weight gain: I am worried you are developing sleep apnea. We will hold off on ordering the sleep study until the orange card is in place. I do strongly recommend weight loss, as this can treat sleep apnea.  ? ?Your medications have refills at the pharmacy ?----------------------------------------- ??Fue agradable verte hoy! Gracias por elegir [facility] para su Atenci?n Primaria. ? ?Hoy hablamos de: ? ?1. Presi?n arterial alta: ?Tu presi?n arterial hoy est? mucho mejor! Contin?e tomando Hidroclorotiazida diariamente. ? ?2. Aumento de peso: Me preocupa que est? desarrollando apnea del sue?o. No pediremos el estudio del sue?o hasta que la tarjeta naranja est? en su lugar. Recomiendo encarecidamente la p?rdida de peso, ya que esto puede tratar la apnea del sue?o. ? ?3. Sus medicamentos tienen Pension scheme manager farmacia ?

## 2021-09-13 NOTE — Progress Notes (Signed)
? ?  CC: HTN ? ?HPI: ? ?Faith Braun is a 51 y.o. with a PMHx as listed below who presents to the clinic for HTN.  ? ?Please see the Encounters tab for problem-based Assessment & Plan regarding status of patient's acute and chronic conditions. ? ?No past medical history on file. ? ?Review of Systems: Review of Systems  ?Constitutional:  Positive for malaise/fatigue (occasionally). Negative for chills, fever and weight loss.  ?HENT:  Negative for sinus pain and sore throat.   ?Respiratory:  Negative for cough, sputum production, shortness of breath and wheezing.   ?Cardiovascular:  Positive for palpitations (sometimes when she wakes up, self resolves). Negative for chest pain, orthopnea and leg swelling.  ?Gastrointestinal:  Negative for abdominal pain, constipation, diarrhea, nausea and vomiting.  ?Neurological:  Negative for dizziness and headaches.  ? ?Physical Exam: ? ?Vitals:  ? 09/13/21 0852  ?BP: (!) 126/49  ?Pulse: 76  ?Temp: 98.1 ?F (36.7 ?C)  ?TempSrc: Oral  ?SpO2: 98%  ?Weight: 206 lb 4.8 oz (93.6 kg)  ? ?Physical Exam ?Vitals and nursing note reviewed.  ?Constitutional:   ?   Appearance: She is morbidly obese.  ?HENT:  ?   Head: Normocephalic and atraumatic.  ?Eyes:  ?   Conjunctiva/sclera: Conjunctivae normal.  ?   Pupils: Pupils are equal, round, and reactive to light.  ?Cardiovascular:  ?   Rate and Rhythm: Normal rate and regular rhythm.  ?   Heart sounds: No murmur heard. ?  No gallop.  ?Pulmonary:  ?   Effort: Pulmonary effort is normal. No respiratory distress.  ?   Breath sounds: Normal breath sounds. No wheezing or rales.  ?Musculoskeletal:  ?   Right lower leg: No edema.  ?   Left lower leg: No edema.  ?Skin: ?   General: Skin is warm and dry.  ?Neurological:  ?   General: No focal deficit present.  ?   Mental Status: She is alert and oriented to person, place, and time. Mental status is at baseline.  ?Psychiatric:     ?   Mood and Affect: Mood normal.     ?   Behavior:  Behavior normal.     ?   Thought Content: Thought content normal.     ?   Judgment: Judgment normal.  ? ? ?Assessment & Plan:  ? ?See Encounters Tab for problem based charting. ? ?Patient discussed with Dr. Antony Contras ? ?

## 2021-09-13 NOTE — Assessment & Plan Note (Signed)
[name] states that her hot flashes have completely resolved.  She continues to take Lexapro. ? ?Assessment/plan: ?- Continue Lexapro 10 mg daily.  We will hold off on titration given resolution of symptoms. ?

## 2021-09-13 NOTE — Assessment & Plan Note (Signed)
BP: (!) 126/49 ? ?[name] states that she has been taking hydrochlorothiazide daily without any difficulty.  She denies any dizziness, headache. ? ?Assessment/plan: ?Blood pressure significantly improved. ? ?- Continue hydrochlorothiazide 25 mg daily ?- Obtain BMP at next visit ?

## 2021-09-13 NOTE — Assessment & Plan Note (Signed)
[name] states that her husband has told her recently that she has begun to snore very badly within the last year.  She states this coincides with significant weight gain and she is wondering if this is related.  She notes occasionally when she wakes up in the morning, she does not feel refreshed and has palpitations.  This does not happen on a daily basis though. ? ?Assessment/plan: ?Given patient's onset of snoring that coincides with weight gain, I am concerned that patient is developing sleep apnea.  STOP-BANG elevated at 4, placing patient at intermediate risk.  Unfortunately, she is uninsured at this time but in the process of obtaining the orange card.  Unfortunately the orange card does not cover CPAP machines.  We discussed extensively weight loss and I provided her some PDFs for further information. ?

## 2021-09-20 NOTE — Progress Notes (Signed)
Internal Medicine Clinic Attending  Case discussed with Dr. Basaraba  At the time of the visit.  We reviewed the resident's history and exam and pertinent patient test results.  I agree with the assessment, diagnosis, and plan of care documented in the resident's note.  

## 2021-10-18 ENCOUNTER — Other Ambulatory Visit (HOSPITAL_COMMUNITY): Payer: Self-pay

## 2021-11-28 ENCOUNTER — Other Ambulatory Visit (HOSPITAL_COMMUNITY): Payer: Self-pay

## 2021-12-24 ENCOUNTER — Encounter: Payer: Self-pay | Admitting: *Deleted

## 2022-01-02 ENCOUNTER — Other Ambulatory Visit (HOSPITAL_COMMUNITY): Payer: Self-pay

## 2022-02-04 ENCOUNTER — Other Ambulatory Visit (HOSPITAL_COMMUNITY): Payer: Self-pay

## 2022-02-04 ENCOUNTER — Other Ambulatory Visit: Payer: Self-pay | Admitting: Internal Medicine

## 2022-02-05 ENCOUNTER — Other Ambulatory Visit (HOSPITAL_COMMUNITY): Payer: Self-pay

## 2022-02-05 MED ORDER — HYDROCHLOROTHIAZIDE 25 MG PO TABS
25.0000 mg | ORAL_TABLET | Freq: Every day | ORAL | 3 refills | Status: DC
  Filled 2022-02-05: qty 30, 30d supply, fill #0
  Filled 2022-03-12: qty 30, 30d supply, fill #1
  Filled 2022-04-11: qty 30, 30d supply, fill #2
  Filled 2022-05-13: qty 30, 30d supply, fill #3
  Filled 2022-06-12: qty 30, 30d supply, fill #4
  Filled 2022-07-16: qty 30, 30d supply, fill #5
  Filled 2022-08-15: qty 30, 30d supply, fill #6
  Filled 2022-09-16: qty 30, 30d supply, fill #7
  Filled 2022-10-16: qty 30, 30d supply, fill #8
  Filled 2022-11-15: qty 30, 30d supply, fill #9
  Filled 2022-12-17: qty 30, 30d supply, fill #10
  Filled 2023-01-20: qty 30, 30d supply, fill #11

## 2022-02-06 ENCOUNTER — Other Ambulatory Visit (HOSPITAL_COMMUNITY): Payer: Self-pay

## 2022-03-12 ENCOUNTER — Other Ambulatory Visit (HOSPITAL_COMMUNITY): Payer: Self-pay

## 2022-04-11 ENCOUNTER — Other Ambulatory Visit (HOSPITAL_COMMUNITY): Payer: Self-pay

## 2022-05-13 ENCOUNTER — Other Ambulatory Visit (HOSPITAL_COMMUNITY): Payer: Self-pay

## 2022-06-12 ENCOUNTER — Other Ambulatory Visit (HOSPITAL_COMMUNITY): Payer: Self-pay

## 2022-07-16 ENCOUNTER — Other Ambulatory Visit (HOSPITAL_COMMUNITY): Payer: Self-pay

## 2022-08-15 ENCOUNTER — Other Ambulatory Visit (HOSPITAL_COMMUNITY): Payer: Self-pay

## 2022-09-16 ENCOUNTER — Other Ambulatory Visit (HOSPITAL_COMMUNITY): Payer: Self-pay

## 2022-10-16 ENCOUNTER — Other Ambulatory Visit (HOSPITAL_COMMUNITY): Payer: Self-pay

## 2022-11-15 ENCOUNTER — Other Ambulatory Visit (HOSPITAL_COMMUNITY): Payer: Self-pay

## 2022-12-17 ENCOUNTER — Other Ambulatory Visit (HOSPITAL_COMMUNITY): Payer: Self-pay

## 2023-01-20 ENCOUNTER — Other Ambulatory Visit (HOSPITAL_COMMUNITY): Payer: Self-pay

## 2023-05-14 ENCOUNTER — Other Ambulatory Visit (HOSPITAL_COMMUNITY): Payer: Self-pay

## 2023-05-14 ENCOUNTER — Ambulatory Visit: Payer: Self-pay | Admitting: Student

## 2023-05-14 VITALS — BP 161/79 | HR 67 | Temp 98.3°F | Ht 60.0 in | Wt 209.2 lb

## 2023-05-14 DIAGNOSIS — I10 Essential (primary) hypertension: Secondary | ICD-10-CM

## 2023-05-14 DIAGNOSIS — Z0000 Encounter for general adult medical examination without abnormal findings: Secondary | ICD-10-CM

## 2023-05-14 MED ORDER — LOSARTAN POTASSIUM 50 MG PO TABS
50.0000 mg | ORAL_TABLET | Freq: Every day | ORAL | 5 refills | Status: DC
  Filled 2023-05-14: qty 30, 30d supply, fill #0
  Filled 2023-06-12: qty 30, 30d supply, fill #1
  Filled 2023-07-10: qty 30, 30d supply, fill #2
  Filled 2023-08-11: qty 30, 30d supply, fill #3
  Filled 2023-09-10: qty 30, 30d supply, fill #4
  Filled 2023-10-10: qty 30, 30d supply, fill #5

## 2023-05-14 NOTE — Progress Notes (Unsigned)
CC: Blood pressure follow-up  HPI:  Faith Braun is a 52 y.o. female living with a history stated below and presents today for blood pressure follow-up. Please see problem based assessment and plan for additional details.  No past medical history on file.  Current Outpatient Medications on File Prior to Visit  Medication Sig Dispense Refill   escitalopram (LEXAPRO) 10 MG tablet Take 1 tablet (10 mg total) by mouth daily. 30 tablet 2   [DISCONTINUED] Prenatal Multivit-Min-Fe-FA (PRENATAL VITAMINS) 0.8 MG tablet Take 1 tablet by mouth daily.       No current facility-administered medications on file prior to visit.    Family History  Problem Relation Age of Onset   Diabetes Mother    Hypertension Mother    Diabetes Sister    Breast cancer Cousin 1    Social History   Socioeconomic History   Marital status: Married    Spouse name: Not on file   Number of children: Not on file   Years of education: Not on file   Highest education level: 11th grade  Occupational History   Not on file  Tobacco Use   Smoking status: Never   Smokeless tobacco: Never  Vaping Use   Vaping status: Never Used  Substance and Sexual Activity   Alcohol use: No   Drug use: No   Sexual activity: Yes    Birth control/protection: Condom  Other Topics Concern   Not on file  Social History Narrative   No transportation needs. 08/16/19   Social Determinants of Health   Financial Resource Strain: Not on file  Food Insecurity: Not on file  Transportation Needs: No Transportation Needs (07/29/2019)   PRAPARE - Administrator, Civil Service (Medical): No    Lack of Transportation (Non-Medical): No  Physical Activity: Not on file  Stress: Not on file  Social Connections: Not on file  Intimate Partner Violence: Not on file    Review of Systems: ROS negative except for what is noted on the assessment and plan.  Vitals:   05/14/23 0846 05/14/23 0907  BP: (!)  157/76 (!) 161/79  Pulse: 73 67  Temp: 98.3 F (36.8 C)   TempSrc: Oral   SpO2: 97%   Weight: 209 lb 3.2 oz (94.9 kg)   Height: 5' (1.524 m)     Physical Exam: Constitutional: well-appearing female in no acute distress Cardiovascular: regular rate and rhythm, no m/r/g Pulmonary/Chest: normal work of breathing on room air, lungs clear to auscultation bilaterally Abdominal: soft, non-tender, non-distended  Assessment & Plan:   Hypertension Patient presents for follow-up regarding her blood pressure.  Blood pressure today in clinic is 158/76, on repeat is 161/79.  She has been taking hydrochlorothiazide for 3 years now, however has been out for the last month or so per patient.  She does not check her blood pressure at home.  Her last visit with [place] was about a year ago.  Given the patient's last follow-up once a year ago, we will stop the hydrochlorothiazide and start losartan 50 mg.  She may also need another agent at her next follow-up in a month given her blood pressure readings.  I also encouraged patient to continue to exercise and try to lose weight, which could help with her blood pressure as well.  Plan: - Start losartan 50 mg, stop hydrochlorothiazide 25 mg - Check BMP today for evaluation of kidney function and electrolytes    Healthcare maintenance Number provided for  wisewoman program.  She is working on getting her orange card paperwork completed.  Patient discussed with Dr. Pauline Good Micaila Ziemba, M.D. [facility], PGY-2 Pager: [phone] Date 05/14/2023 Time 9:21 AM

## 2023-05-14 NOTE — Assessment & Plan Note (Addendum)
Patient presents for follow-up regarding her blood pressure.  Blood pressure today in clinic is 158/76, on repeat is 161/79.  She has been taking hydrochlorothiazide for 3 years now, however has been out for the last month or so per patient.  She does not check her blood pressure at home.  Her last visit with [place] was about a year ago.  Given the patient's last follow-up once a year ago, we will stop the hydrochlorothiazide and start losartan 50 mg.  She may also need another agent at her next follow-up in a month given her blood pressure readings.  I also encouraged patient to continue to exercise and try to lose weight, which could help with her blood pressure as well.  Plan: - Start losartan 50 mg, stop hydrochlorothiazide 25 mg - Check BMP today for evaluation of kidney function and electrolytes

## 2023-05-14 NOTE — Assessment & Plan Note (Signed)
Number provided for wisewoman program.  She is working on getting her orange card paperwork completed.

## 2023-05-14 NOTE — Patient Instructions (Addendum)
Thank you so much for coming to the clinic today!   We are changing your blood pressure medication from the hydrochlorothiazide to Losartan. We will also check labs today.   If you have any questions please feel free to the call the clinic at anytime at [phone]. It was a pleasure seeing you!  Best, Dr. Vergie Living gracias por venir a la clnica hoy!   Estamos cambiando su medicamento para la presin arterial de hidroclorotiazida a losartn. Tambin revisaremos los laboratorios hoy.   Si tiene [employer], no dude en llamar a la clnica en cualquier momento al [phone]. Fue Psychiatrist verte!  Mejor, Dr.Samanvitha Germany

## 2023-05-15 LAB — BMP8+ANION GAP
Anion Gap: 17 mmol/L (ref 10.0–18.0)
BUN/Creatinine Ratio: 30 — ABNORMAL HIGH (ref 9–23)
BUN: 13 mg/dL (ref 6–24)
CO2: 22 mmol/L (ref 20–29)
Calcium: 9.3 mg/dL (ref 8.7–10.2)
Chloride: 103 mmol/L (ref 96–106)
Creatinine, Ser: 0.43 mg/dL — ABNORMAL LOW (ref 0.57–1.00)
Glucose: 107 mg/dL — ABNORMAL HIGH (ref 70–99)
Potassium: 4.2 mmol/L (ref 3.5–5.2)
Sodium: 142 mmol/L (ref 134–144)
eGFR: 117 mL/min/{1.73_m2} (ref >59)

## 2023-05-22 NOTE — Progress Notes (Signed)
Internal Medicine Clinic Attending  Case discussed with the resident at the time of the visit.  We reviewed the resident's history and exam and pertinent patient test results.  I agree with the assessment, diagnosis, and plan of care documented in the resident's note.  

## 2023-06-12 ENCOUNTER — Other Ambulatory Visit (HOSPITAL_COMMUNITY): Payer: Self-pay

## 2023-06-13 ENCOUNTER — Encounter: Payer: Self-pay | Admitting: Student

## 2023-06-13 ENCOUNTER — Ambulatory Visit (INDEPENDENT_AMBULATORY_CARE_PROVIDER_SITE_OTHER): Payer: Self-pay | Admitting: Student

## 2023-06-13 VITALS — BP 144/66 | HR 69 | Temp 98.2°F | Ht 60.0 in | Wt 207.9 lb

## 2023-06-13 DIAGNOSIS — I10 Essential (primary) hypertension: Secondary | ICD-10-CM

## 2023-06-13 DIAGNOSIS — Z6841 Body Mass Index (BMI) 40.0 and over, adult: Secondary | ICD-10-CM

## 2023-06-13 DIAGNOSIS — Z0000 Encounter for general adult medical examination without abnormal findings: Secondary | ICD-10-CM

## 2023-06-13 DIAGNOSIS — Z139 Encounter for screening, unspecified: Secondary | ICD-10-CM

## 2023-06-13 NOTE — Progress Notes (Signed)
Subjective:  CC: High blood pressure  HPI:  [name] is a 52 y.o. female with a past medical history stated below and presents today for hypertension. Please see problem based assessment and plan for additional details.  History reviewed. No pertinent past medical history.  Current Outpatient Medications on File Prior to Visit  Medication Sig Dispense Refill   losartan (COZAAR) 50 MG tablet Take 1 tablet (50 mg total) by mouth daily. 30 tablet 5   [DISCONTINUED] Prenatal Multivit-Min-Fe-FA (PRENATAL VITAMINS) 0.8 MG tablet Take 1 tablet by mouth daily.       No current facility-administered medications on file prior to visit.    Family History  Problem Relation Age of Onset   Diabetes Mother    Hypertension Mother    Diabetes Sister    Breast cancer Cousin 28    Social History   Socioeconomic History   Marital status: Married    Spouse name: Not on file   Number of children: Not on file   Years of education: Not on file   Highest education level: 11th grade  Occupational History   Not on file  Tobacco Use   Smoking status: Never   Smokeless tobacco: Never  Vaping Use   Vaping status: Never Used  Substance and Sexual Activity   Alcohol use: No   Drug use: No   Sexual activity: Yes    Birth control/protection: Condom  Other Topics Concern   Not on file  Social History Narrative   No transportation needs. 08/16/19   Social Drivers of Corporate investment banker Strain: Not on file  Food Insecurity: Not on file  Transportation Needs: No Transportation Needs (07/29/2019)   PRAPARE - Administrator, Civil Service (Medical): No    Lack of Transportation (Non-Medical): No  Physical Activity: Not on file  Stress: Not on file  Social Connections: Not on file  Intimate Partner Violence: Not on file    Review of Systems: ROS negative except for what is noted on the assessment and plan.  Objective:   Vitals:   06/13/23  0844 06/13/23 0906  BP: 135/63 (!) 144/66  Pulse: 74 69  Temp: 98.2 F (36.8 C)   TempSrc: Oral   SpO2: 97%   Weight: 207 lb 14.4 oz (94.3 kg)   Height: 5' (1.524 m)     Physical Exam: Constitutional: well-appearing owman sitting in chair, in no acute distress HENT: normocephalic atraumatic, mucous membranes moist Eyes: conjunctiva non-erythematous Neck: supple Cardiovascular: regular rate and rhythm, no m/r/g Pulmonary/Chest: normal work of breathing on room air, lungs clear to auscultation bilaterally Abdominal: soft, non-tender, non-distended MSK: normal bulk and tone Neurological: alert & oriented x 3 Skin: warm and dry Psych: pleasant mood and affect       09/13/2021   10:06 AM  Depression screen PHQ 2/9  Decreased Interest 2  Down, Depressed, Hopeless 2  PHQ - 2 Score 4  Altered sleeping 0  Tired, decreased energy 1  Change in appetite 1  Feeling bad or failure about yourself  1  Trouble concentrating 0  Moving slowly or fidgety/restless 1  Suicidal thoughts 0  PHQ-9 Score 8  Difficult doing work/chores Not difficult at all       09/25/2020   10:17 AM  GAD 7 : Generalized Anxiety Score  Nervous, Anxious, on Edge 1  Control/stop worrying 3  Worry too much - different things 3  Trouble relaxing 0  Restless 0  Easily annoyed or irritable 1  Afraid - awful might happen 0  Total GAD 7 Score 8  Anxiety Difficulty Not difficult at all     Assessment & Plan:   Hypertension Returns for follow up. Adherent to Losartan 50 mg daily without hypotensive symptoms. Denies HA, chest pain, SHOB. Currently not on statin therapy and has not had this levels checked as she is waiting for orange card. Her SBPs at home usually <130. STOP BANG score of 5. BP today 136/69, improved from SBP 160s at last visit.  -Continue Losartan 50 mg daily -BMP today -Referral for sleep studies for high risk of OSA deferred until orange card approval  BMI 40.0-44.9, adult (HCC) BMI of 40  with co-morbidities. Patient is at high risk for metabolic disease. Discussed lifestyle modifications.  -Once on orange card, screen for dyslipidemia, A1c  Encounter for screening involving social determinants of health (SDoH) Orange card paperwork to patient  Healthcare maintenance Patient will get PAP smear [facility] and schedule mammogram through [place] [facility]. Deferred other in-clinic testing until [facility] Card approval    Return in about 3 months (around 09/11/2023) for hypertension .  Patient discussed with Dr. Georgia Dom Daiva Eves, MD [facility] Internal Medicine Residency Program  06/13/2023, 9:27 AM   Blood pressure

## 2023-06-13 NOTE — Assessment & Plan Note (Signed)
Orange card paperwork to patient

## 2023-06-13 NOTE — Patient Instructions (Addendum)
[name] , gracias por permitirnos ayudarle hoy. Durante esta visita hemos hablado acerca de los siguientes asuntos medicos:   Presion arterial - Continue tomando Losartan 50 mg diarios   Por favor contacta a Carlos para que te ayude con la tarjeta naranja  Para su mamografia - por favor llame este number con alguien que le ayude a habalr ingles. Hay una clinica mobil del [facility] que have mamografias gratis: [phone]. Con ese numero puede agendar sy cita   Llame a Wise Woman para que le hagan sus examenes preventivos    -Ejercicios - 30 minutos por lo menos 4-5 dias de la semana - en el mall, supermecado, o en el gimnasio -Cuida tu alimentacion con verduras y proteina. Y mnos panes, arroz, tortillas y grasas saturadas como frituras y papitas y chips  -[place] medias de compression   He ordenado los siguientes exmenes de laboratorio:  Lab Orders         BMP8+Anion Gap        Cita de seguimiento:  3 meses  Para contactarnos: Esperamos verte la prxima vez. Llame a nuestra clnica al telefono: [phone] si tiene alguna pregunta o inquietud. El mejor horario para llamar es de lunes a viernes de 9 a. m. a 4 p. m. Si es fuera del horario de atencin o durante el fin de [place], llame al nmero principal del hospital y pregunte por el residente de guardia de medicina interna. Si necesita reposicin de medicamentos, por favor notifique a su farmacia con una semana de anticipacin y ellos nos enviarn una solicitud.  Gracias por confiar en nosotros para cuidar de usted!   Morene Crocker, MD [facility]

## 2023-06-13 NOTE — Assessment & Plan Note (Signed)
Returns for follow up. Adherent to Losartan 50 mg daily without hypotensive symptoms. Denies HA, chest pain, SHOB. Currently not on statin therapy and has not had this levels checked as she is waiting for orange card. Her SBPs at home usually <130. STOP BANG score of 5. BP today 136/69, improved from SBP 160s at last visit.  -Continue Losartan 50 mg daily -BMP today -Referral for sleep studies for high risk of OSA deferred until orange card approval

## 2023-06-13 NOTE — Assessment & Plan Note (Signed)
BMI of 40 with co-morbidities. Patient is at high risk for metabolic disease. Discussed lifestyle modifications.  -Once on orange card, screen for dyslipidemia, A1c

## 2023-06-13 NOTE — Assessment & Plan Note (Signed)
Patient will get PAP smear through [facility] and schedule mammogram through [place] [facility]. Deferred other in-clinic testing until [employer]

## 2023-06-14 LAB — BMP8+ANION GAP
Anion Gap: 11 mmol/L (ref 10.0–18.0)
BUN/Creatinine Ratio: 32 — ABNORMAL HIGH (ref 9–23)
BUN: 15 mg/dL (ref 6–24)
CO2: 23 mmol/L (ref 20–29)
Calcium: 8.9 mg/dL (ref 8.7–10.2)
Chloride: 107 mmol/L — ABNORMAL HIGH (ref 96–106)
Creatinine, Ser: 0.47 mg/dL — ABNORMAL LOW (ref 0.57–1.00)
Glucose: 107 mg/dL — ABNORMAL HIGH (ref 70–99)
Potassium: 4.3 mmol/L (ref 3.5–5.2)
Sodium: 141 mmol/L (ref 134–144)
eGFR: 114 mL/min/{1.73_m2} (ref >59)

## 2023-06-21 NOTE — Progress Notes (Signed)
Internal Medicine Clinic Attending  Case discussed with the resident at the time of the visit.  We reviewed the resident's history and exam and pertinent patient test results.  I agree with the assessment, diagnosis, and plan of care documented in the resident's note.  

## 2023-06-23 ENCOUNTER — Encounter: Payer: Self-pay | Admitting: Student

## 2023-06-23 NOTE — Progress Notes (Signed)
Stable No changes 

## 2023-07-10 ENCOUNTER — Other Ambulatory Visit (HOSPITAL_COMMUNITY): Payer: Self-pay

## 2023-08-11 ENCOUNTER — Other Ambulatory Visit (HOSPITAL_COMMUNITY): Payer: Self-pay

## 2023-09-10 ENCOUNTER — Other Ambulatory Visit (HOSPITAL_COMMUNITY): Payer: Self-pay

## 2023-10-10 ENCOUNTER — Other Ambulatory Visit (HOSPITAL_COMMUNITY): Payer: Self-pay

## 2023-11-14 ENCOUNTER — Other Ambulatory Visit: Payer: Self-pay | Admitting: Student

## 2023-11-14 ENCOUNTER — Other Ambulatory Visit (HOSPITAL_COMMUNITY): Payer: Self-pay

## 2023-11-14 ENCOUNTER — Telehealth: Payer: Self-pay | Admitting: Student

## 2023-11-14 NOTE — Telephone Encounter (Signed)
 Copied from CRM [id number]. Topic: Clinical - Medication Refill >> Nov 14, 2023  4:13 PM Tisa Forester wrote: Medication:  Disp Refills Start End  losartan  (COZAAR ) 50 MG tablet       Has the patient contacted their pharmacy? Yes (Agent: If no, request that the patient contact the pharmacy for the refill. If patient does not wish to contact the pharmacy document the reason why and proceed with request.) (Agent: If yes, when and what did the pharmacy advise?)  This is the patient's preferred pharmacy:  [facility] [address] [place] [address] Phone: [phone] Fax: [phone]  Is this the correct pharmacy for this prescription? Yes If no, delete pharmacy and type the correct one.   Has the prescription been filled recently? No  Is the patient out of the medication? Yes ,been out of the medication for 4 days  Has the patient been seen for an appointment in the last year OR does the patient have an upcoming appointment? Yes  Can we respond through MyChart? No patient perfer a text message   Agent: Please be advised that Rx refills may take up to 3 business days. We ask that you follow-up with your pharmacy.

## 2023-11-14 NOTE — Telephone Encounter (Signed)
 Active medication request from pharmacy in system.

## 2023-11-17 ENCOUNTER — Other Ambulatory Visit (HOSPITAL_COMMUNITY): Payer: Self-pay

## 2023-11-17 MED ORDER — LOSARTAN POTASSIUM 50 MG PO TABS
50.0000 mg | ORAL_TABLET | Freq: Every day | ORAL | 5 refills | Status: DC
  Filled 2023-11-17: qty 30, 30d supply, fill #0

## 2023-11-17 NOTE — Telephone Encounter (Signed)
 Name: Faith Braun, Faith Braun MRN: [id number]  Date: 11/19/2023 Status: Sch  Time: 9:15 AM Length: 30  Visit Type: OPEN ESTABLISHED [726] Copay: $0.00  Provider: Maxie Spaniel, MD

## 2023-11-17 NOTE — Telephone Encounter (Signed)
 Patient last seen 06/13/23, I called the patient to schedule a follow up appointment. Unable to reach the patient, I lvm for her to give us  a call back.

## 2023-11-17 NOTE — Telephone Encounter (Signed)
 Name: Morgane, Joerger MRN: [id number]  Date: 11/19/2023 Status: Sch  Time: 9:15 AM Length: 30  Visit Type: OPEN ESTABLISHED [726] Copay: $0.00  Provider: Maxie Spaniel, MD       Copied from CRM [id number]. Topic: Appointments - Scheduling Inquiry for Clinic >> Nov 14, 2023  4:09 PM Tisa Forester wrote: Reason for CRM: Patient requesting an to get appointment soon reason need medication refill for the Losartan  , the first available appointment is for 12/15/23 need something sooner , if can call patient back to schedule for medication refill checkup  Call back # [phone]

## 2023-11-19 ENCOUNTER — Other Ambulatory Visit (HOSPITAL_COMMUNITY): Payer: Self-pay

## 2023-11-19 ENCOUNTER — Encounter: Payer: Self-pay | Admitting: Student

## 2023-11-19 ENCOUNTER — Ambulatory Visit (INDEPENDENT_AMBULATORY_CARE_PROVIDER_SITE_OTHER): Payer: Self-pay | Admitting: Student

## 2023-11-19 ENCOUNTER — Other Ambulatory Visit: Payer: Self-pay

## 2023-11-19 VITALS — BP 137/53 | HR 66 | Temp 98.0°F | Wt 210.6 lb

## 2023-11-19 DIAGNOSIS — Z0000 Encounter for general adult medical examination without abnormal findings: Secondary | ICD-10-CM

## 2023-11-19 DIAGNOSIS — I10 Essential (primary) hypertension: Secondary | ICD-10-CM

## 2023-11-19 MED ORDER — LOSARTAN POTASSIUM 50 MG PO TABS
50.0000 mg | ORAL_TABLET | Freq: Every day | ORAL | 11 refills | Status: AC
  Filled 2023-11-19: qty 30, 30d supply, fill #0
  Filled 2023-12-19: qty 30, 30d supply, fill #1
  Filled 2024-01-20: qty 30, 30d supply, fill #2
  Filled 2024-02-19: qty 30, 30d supply, fill #3
  Filled 2024-03-19: qty 30, 30d supply, fill #4
  Filled 2024-04-19: qty 30, 30d supply, fill #5
  Filled 2024-05-24: qty 30, 30d supply, fill #6
  Filled 2024-06-22: qty 30, 30d supply, fill #7
  Filled 2024-07-22: qty 30, 30d supply, fill #8

## 2023-11-19 NOTE — Progress Notes (Signed)
 Internal Medicine Clinic Attending  Case discussed with the resident at the time of the visit.  We reviewed the resident's history and exam and pertinent patient test results.  I agree with the assessment, diagnosis, and plan of care documented in the resident's note.

## 2023-11-19 NOTE — Assessment & Plan Note (Signed)
 Patient has received an orange card in the past but has not filled this out.  She was counseled on the importance of completing this and bring it to the clinic so we can collect additional labs.  Once this is completed, would recommend getting an A1c and lipid panel. - Counsel on completing orange card

## 2023-11-19 NOTE — Patient Instructions (Addendum)
 Thank you so much for coming to the clinic today!   I have sent in the refill. We will see you in 6 months   If you have any questions please feel free to the call the clinic at anytime at [phone]. It was a pleasure seeing you!  Best, Dr. Carolee Churchman   ----------------------------------------------------- Muchas gracias por venir hoy a la clnica!  Ya envi la receta. Nos vemos en 6 meses.  Si tiene [employer], no dude en llamar a la clnica en cualquier momento al [phone]. Fue un placer verle!  Saludos cordiales, Dr. Carolee Churchman

## 2023-11-19 NOTE — Progress Notes (Signed)
 CC: Hypertension follow-up  HPI: [name] is a 53 y.o. female living with a history stated below and presents today for hypertension follow-up. Please see problem based assessment and plan for additional details.  History reviewed. No pertinent past medical history.  Current Outpatient Medications on File Prior to Visit  Medication Sig Dispense Refill   [DISCONTINUED] Prenatal Multivit-Min-Fe-FA (PRENATAL VITAMINS) 0.8 MG tablet Take 1 tablet by mouth daily.       No current facility-administered medications on file prior to visit.    Family History  Problem Relation Age of Onset   Diabetes Mother    Hypertension Mother    Diabetes Sister    Breast cancer Cousin 75    Social History   Socioeconomic History   Marital status: Married    Spouse name: Not on file   Number of children: Not on file   Years of education: Not on file   Highest education level: 11th grade  Occupational History   Not on file  Tobacco Use   Smoking status: Never   Smokeless tobacco: Never  Vaping Use   Vaping status: Never Used  Substance and Sexual Activity   Alcohol use: No   Drug use: No   Sexual activity: Yes    Birth control/protection: Condom  Other Topics Concern   Not on file  Social History Narrative   No transportation needs. 08/16/19   Social Drivers of Corporate investment banker Strain: Not on file  Food Insecurity: Not on file  Transportation Needs: No Transportation Needs (07/29/2019)   PRAPARE - Administrator, Civil Service (Medical): No    Lack of Transportation (Non-Medical): No  Physical Activity: Not on file  Stress: Not on file  Social Connections: Not on file  Intimate Partner Violence: Not on file    Review of Systems: ROS negative except for what is noted on the assessment and plan.  Vitals:   11/19/23 0855 11/19/23 0858  BP: (!) 149/52 (!) 137/53  Pulse: 68 66  Temp: 98 F (36.7 C)   TempSrc: Oral   SpO2: 97%    Weight: 210 lb 9.6 oz (95.5 kg)     Physical Exam: Constitutional: well-appearing in no acute distress HENT: normocephalic atraumatic, mucous membranes moist Eyes: conjunctiva non-erythematous Neck: supple Cardiovascular: regular rate and rhythm, no m/r/g Pulmonary/Chest: normal work of breathing on room air, lungs clear to auscultation bilaterally Abdominal: soft, non-tender, non-distended MSK: normal bulk and tone Neurological: alert & oriented x 3, 5/5 strength in bilateral upper and lower extremities, normal gait Skin: warm and dry  Assessment & Plan:   Hypertension Patient last seen in the clinic on 06/13/2023.  Current medication includes losartan  50 mg daily.  Blood pressure today is 149/52. Repeat reading is 137/53. No symptoms. BMP in December 2024 was normal. She has a blood pressure cuff at home but has not been using it so she was educated on the importance of measuring, logging, and bringing these readings to future appointments.  - Continue losartan  50 mg daily  - Encourage blood pressure logging at home  Healthcare maintenance Patient has received an orange card in the past but has not filled this out.  She was counseled on the importance of completing this and bring it to the clinic so we can collect additional labs.  Once this is completed, would recommend getting an A1c and lipid panel. - Counsel on completing orange card  Patient discussed with Dr. Aloha Arnold  Carolee Churchman, MD  [facility] Internal Medicine, PGY-1 Date 11/19/2023 Time 9:18 AM

## 2023-11-19 NOTE — Assessment & Plan Note (Signed)
 Patient last seen in the clinic on 06/13/2023.  Current medication includes losartan  50 mg daily.  Blood pressure today is 149/52. Repeat reading is 137/53. No symptoms. BMP in December 2024 was normal. She has a blood pressure cuff at home but has not been using it so she was educated on the importance of measuring, logging, and bringing these readings to future appointments.  - Continue losartan  50 mg daily  - Encourage blood pressure logging at home

## 2023-12-19 ENCOUNTER — Other Ambulatory Visit (HOSPITAL_COMMUNITY): Payer: Self-pay

## 2024-01-20 ENCOUNTER — Other Ambulatory Visit (HOSPITAL_COMMUNITY): Payer: Self-pay

## 2024-02-19 ENCOUNTER — Other Ambulatory Visit (HOSPITAL_COMMUNITY): Payer: Self-pay

## 2024-03-18 ENCOUNTER — Telehealth: Payer: Self-pay

## 2024-03-18 NOTE — Telephone Encounter (Signed)
 Patient last seen 11/10/23 I called the patient to schedule a fu appointment. Unable to reach the patient, I lvm for her to give us  a call back.

## 2024-03-19 ENCOUNTER — Other Ambulatory Visit (HOSPITAL_COMMUNITY): Payer: Self-pay

## 2024-04-19 ENCOUNTER — Other Ambulatory Visit (HOSPITAL_COMMUNITY): Payer: Self-pay

## 2024-04-21 ENCOUNTER — Other Ambulatory Visit: Payer: Self-pay

## 2024-05-24 ENCOUNTER — Other Ambulatory Visit (HOSPITAL_COMMUNITY): Payer: Self-pay

## 2024-06-22 ENCOUNTER — Other Ambulatory Visit (HOSPITAL_COMMUNITY): Payer: Self-pay

## 2024-07-22 ENCOUNTER — Other Ambulatory Visit (HOSPITAL_COMMUNITY): Payer: Self-pay
